# Patient Record
Sex: Female | Born: 2000 | Race: Black or African American | Hispanic: Yes | Marital: Married | Smoking: Never smoker
Health system: Southern US, Community
[De-identification: ages and names within clinical notes are randomized; demographics above are authoritative.]

## PROBLEM LIST (undated history)

## (undated) DIAGNOSIS — O139 Gestational [pregnancy-induced] hypertension without significant proteinuria, unspecified trimester: Secondary | ICD-10-CM

## (undated) DIAGNOSIS — Z789 Other specified health status: Secondary | ICD-10-CM

## (undated) DIAGNOSIS — I10 Essential (primary) hypertension: Secondary | ICD-10-CM

## (undated) HISTORY — DX: Gestational (pregnancy-induced) hypertension without significant proteinuria, unspecified trimester: O13.9

## (undated) HISTORY — PX: NO PAST SURGERIES: SHX2092

---
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
## 2018-08-21 ENCOUNTER — Emergency Department (HOSPITAL_COMMUNITY)
Admission: EM | Admit: 2018-08-21 | Discharge: 2018-08-21 | Disposition: A | Discharge status: Home / Self Care | Payer: Self-pay | Attending: Emergency Medicine | Admitting: Emergency Medicine

## 2018-08-21 ENCOUNTER — Other Ambulatory Visit: Payer: Self-pay

## 2018-08-21 ENCOUNTER — Encounter (HOSPITAL_COMMUNITY): Payer: Self-pay | Admitting: Emergency Medicine

## 2018-08-21 DIAGNOSIS — N898 Other specified noninflammatory disorders of vagina: Secondary | ICD-10-CM | POA: Insufficient documentation

## 2018-08-21 DIAGNOSIS — Z202 Contact with and (suspected) exposure to infections with a predominantly sexual mode of transmission: Secondary | ICD-10-CM | POA: Insufficient documentation

## 2018-08-21 DIAGNOSIS — N3000 Acute cystitis without hematuria: Secondary | ICD-10-CM | POA: Insufficient documentation

## 2018-08-21 DIAGNOSIS — L249 Irritant contact dermatitis, unspecified cause: Secondary | ICD-10-CM | POA: Insufficient documentation

## 2018-08-21 LAB — URINALYSIS, ROUTINE W REFLEX MICROSCOPIC
Bilirubin Urine: NEGATIVE
Glucose, UA: NEGATIVE mg/dL
Hgb urine dipstick: NEGATIVE
Ketones, ur: NEGATIVE mg/dL
Leukocytes,Ua: NEGATIVE
Nitrite: POSITIVE — AB
Protein, ur: NEGATIVE mg/dL
Specific Gravity, Urine: 1.02 (ref 1.005–1.030)
pH: 6.5 (ref 5.0–8.0)

## 2018-08-21 LAB — WET PREP, GENITAL
Clue Cells Wet Prep HPF POC: NONE SEEN
Sperm: NONE SEEN
Trich, Wet Prep: NONE SEEN
Yeast Wet Prep HPF POC: NONE SEEN

## 2018-08-21 LAB — URINALYSIS, MICROSCOPIC (REFLEX)

## 2018-08-21 LAB — I-STAT BETA HCG BLOOD, ED (MC, WL, AP ONLY): I-stat hCG, quantitative: <5 m[IU]/mL (ref <5)

## 2018-08-21 MED ORDER — CEFTRIAXONE SODIUM 250 MG IJ SOLR
250.0000 mg | Freq: Once | INTRAMUSCULAR | Status: AC
  Administered 2018-08-21: 250 mg via INTRAMUSCULAR
  Filled 2018-08-21: qty 250

## 2018-08-21 MED ORDER — CEPHALEXIN 500 MG PO CAPS: 500.0000 mg | ORAL_CAPSULE | Freq: Two times a day (BID) | ORAL | 0 refills | Status: AC

## 2018-08-21 MED ORDER — STERILE WATER FOR INJECTION IJ SOLN
INTRAMUSCULAR | Status: AC
  Administered 2018-08-21: 10 mL
  Filled 2018-08-21: qty 10

## 2018-08-21 MED ORDER — AZITHROMYCIN 250 MG PO TABS
1000.0000 mg | ORAL_TABLET | Freq: Once | ORAL | Status: AC
  Administered 2018-08-21: 1000 mg via ORAL
  Filled 2018-08-21: qty 4

## 2018-08-21 MED ORDER — AQUAPHOR EX OINT: TOPICAL_OINTMENT | CUTANEOUS | 0 refills | Status: DC | PRN

## 2018-08-21 MED ORDER — HYDROCORTISONE 1 % EX CREA: TOPICAL_CREAM | CUTANEOUS | 0 refills | Status: DC

## 2018-08-21 NOTE — ED Triage Notes (Signed)
Pt reports a rash on her back, "its getting better but I wanted to get it checked out."

## 2018-08-21 NOTE — Discharge Instructions (Signed)
STD testing was obtained in the emergency department.  You will be called if the results are positive in approximately 3 days. You will need to follow-up with health department if these are positive.  You likely contact dermatitis.  I have given you a steroid prescription.  Do not place to face or genitalia.

## 2018-08-21 NOTE — ED Provider Notes (Signed)
[facility]   CSN: [id number] Arrival date & time: 08/21/18  2114   History   Chief Complaint Chief Complaint  Patient presents with   Rash   HPI Christie Fowler is a 18 y.o. female with no significant past medical history who presents for evaluation of rash.  Patient states she has a rash to her left mid back over the last week.  Patient states rash has significantly improved however she "just wants to get it checked out."  Denies any difficulty breathing, new contacts, lotions, perfumes or insect bites.  Patient states she would also like to be tested for STDs.  She presents with significant other who like to be tested as well.  She is sexually active with female partners.  She intermittently uses protection.  States she has a white vaginal discharge however no abdominal pain.  No prior history of STDs.  Denies fever, chills, nausea, vomiting, chest pain, shortness breath, abdominal pain, pelvic pain, constipation.  Has not take anything for symptoms PTA.  Denies additional aggravating or alleviating factors. Has had some mild burning with urination.  History of fever patient and past medical records.  Interpreter is used.     HPI  History reviewed. No pertinent past medical history.  There are no active problems to display for this patient.   History reviewed. No pertinent surgical history.   OB History   No obstetric history on file.      Home Medications    Prior to Admission medications   Medication Sig Start Date End Date Taking? Authorizing Provider  cephALEXin (KEFLEX) 500 MG capsule Take 1 capsule (500 mg total) by mouth 2 (two) times daily for 5 days. 08/21/18 08/26/18  Brighten Orndoff A, PA-C  hydrocortisone cream 1 % Apply to affected area 2 times daily. Do not apply to the face 08/21/18   Tayjah Lobdell A, PA-C  mineral oil-hydrophilic petrolatum (AQUAPHOR) ointment Apply topically as needed for dry skin. 08/21/18    Ronnita Paz A, PA-C    Family History No family history on file.  Social History Social History   Tobacco Use   Smoking status: Not on file  Substance Use Topics   Alcohol use: Not on file   Drug use: Not on file     Allergies   Patient has no known allergies.   Review of Systems Review of Systems  Constitutional: Negative.   HENT: Negative.   Respiratory: Negative.   Cardiovascular: Negative.   Gastrointestinal: Negative.   Genitourinary: Positive for vaginal discharge. Negative for decreased urine volume, difficulty urinating, dysuria, flank pain, frequency, genital sores, hematuria, menstrual problem, pelvic pain, vaginal bleeding and vaginal pain.  Musculoskeletal: Negative.   Skin: Positive for rash.  All other systems reviewed and are negative.    Physical Exam Updated Vital Signs BP 113/73 (BP Location: Right Arm)    Pulse 65    Temp 97.7 F (36.5 C) (Oral)    Resp 12    SpO2 100%   Physical Exam Vitals signs and nursing note reviewed.  Constitutional:      General: She is not in acute distress.    Appearance: She is well-developed. She is not ill-appearing, toxic-appearing or diaphoretic.  HENT:     Head: Normocephalic and atraumatic.     Comments: No oral lesions or oral swelling.    Nose: Nose normal.     Mouth/Throat:     Mouth: Mucous membranes are moist.  Pharynx: Oropharynx is clear.  Eyes:     Pupils: Pupils are equal, round, and reactive to light.  Neck:     Musculoskeletal: Normal range of motion.  Cardiovascular:     Rate and Rhythm: Normal rate.     Pulses: Normal pulses.     Heart sounds: Normal heart sounds. No murmur. No friction rub. No gallop.   Pulmonary:     Effort: Pulmonary effort is normal. No respiratory distress.     Breath sounds: Normal breath sounds.  Abdominal:     General: Bowel sounds are normal. There is no distension.     Tenderness: There is no abdominal tenderness. There is no guarding or rebound.       Hernia: No hernia is present.  Genitourinary:    Comments: Normal appearing external female genitalia without rashes or lesions, normal vaginal epithelium. Normal appearing cervix with white discharge. No petechiae. Cervical os is closed. There is no bleeding noted at the os.No Odor. Bimanual: No CMT,nontender.  No palpable adnexal masses or tenderness. Uterus midline and not fixed. Rectovaginal exam was deferred.  No cystocele or rectocele noted. No pelvic lymphadenopathy noted. Wet prep was obtained.  Cultures for gonorrhea and chlamydia collected. Exam performed with chaperone in room. Musculoskeletal: Normal range of motion.  Skin:    General: Skin is warm and dry.     Comments: No bulla, vesicles, target lesions, urticaria, purpura macules.  Small, punctate areas of papules approximately 1 inch on her left mid back.  No fluctuance, induration.  No redness, swelling or warmth.  Neurological:     Mental Status: She is alert.    ED Treatments / Results  Labs (all labs ordered are listed, but only abnormal results are displayed) Labs Reviewed  WET PREP, GENITAL - Abnormal; Notable for the following components:      Result Value   WBC, Wet Prep HPF POC FEW (*)    All other components within normal limits  URINALYSIS, ROUTINE W REFLEX MICROSCOPIC - Abnormal; Notable for the following components:   APPearance CLOUDY (*)    Nitrite POSITIVE (*)    All other components within normal limits  URINALYSIS, MICROSCOPIC (REFLEX) - Abnormal; Notable for the following components:   Bacteria, UA MANY (*)    All other components within normal limits  RPR  HIV ANTIBODY (ROUTINE TESTING W REFLEX)  I-STAT BETA HCG BLOOD, ED ([facility], [facility], [facility] ONLY)  GC/CHLAMYDIA PROBE AMP ([place]) NOT AT [facility]    EKG None  Radiology No results found.  Procedures Procedures (including critical care time)  Medications Ordered in ED Medications  cefTRIAXone (ROCEPHIN) injection 250 mg (250 mg Intramuscular  Given 08/21/18 2349)  azithromycin (ZITHROMAX) tablet 1,000 mg (1,000 mg Oral Given 08/21/18 2348)  sterile water (preservative free) injection (10 mLs  Given 08/21/18 2349)   Initial Impression / Assessment and Plan / ED Course  I have reviewed the triage vital signs and the nursing notes.  Pertinent labs & imaging results that were available during my care of the patient were reviewed by me and considered in my medical decision making (see chart for details).  18 year old female who appears otherwise well presents for evaluation of 2 separate complaints. Patient with rash over the last week which has majority to resolved.  No new contacts, lotions or perfumes.  No oral lesions or lacerations.  Small, punctate papules to left mid back.  Consistent with contact dermatitis. Patient denies any difficulty breathing or swallowing.  Pt has a  patent airway without stridor and is handling secretions without difficulty; no angioedema. No blisters, no pustules, no warmth, no draining sinus tracts, no superficial abscesses, no bullous impetigo, no vesicles, no desquamation, no target lesions with dusky purpura or a central bulla. Not tender to touch. No concern for superimposed infection. No concern for SJS, TEN, TSS, tick borne illness, syphilis or other life-threatening condition.   Patient also with concerns about STDs.  She has a white vaginal discharge.  Abdomen soft, nontender without rebound or guarding.  She has no adnexal tenderness or cervical motion tenderness.  Low suspicion for PID, torsion or TOA.  Has had some mild burning with urination. Pt understands that they have GC/Chlamydia cultures pending and that they will need to inform all sexual partners if results return positive. Pt has been treated prophylactically with azithromycin and Rocephin due to pts history, pelvic exam, and wet prep with increased WBCs. Patient to be discharged with instructions to follow up with OBGYN/PCP. Discussed importance  of using protection when sexually active.  Urinalysis positive for infection.  DC home with antibiotics.  No evidence of pyelonephritis. Tolerating PO intake without difficulty.  The patient has been appropriately medically screened and/or stabilized in the ED. I have low suspicion for any other emergent medical condition which would require further screening, evaluation or treatment in the ED or require inpatient management. Patient is hemodynamically stable and in no acute distress.  Patient able to ambulate in department prior to ED.  Evaluation does not show acute pathology that would require ongoing or additional emergent interventions while in the emergency department or further inpatient treatment.  I have discussed the diagnosis with the patient and answered all questions.  Patient has no further complaints prior to discharge.  Patient is comfortable with plan discussed in room and is stable for discharge at this time.  I have discussed strict return precautions for returning to the emergency department.  Patient was encouraged to follow-up with PCP/specialist refer to at discharge.     Final Clinical Impressions(s) / ED Diagnoses   Final diagnoses:  Irritant contact dermatitis, unspecified trigger  Possible exposure to STD  Acute cystitis without hematuria    ED Discharge Orders         Ordered    mineral oil-hydrophilic petrolatum (AQUAPHOR) ointment  As needed     08/21/18 2318    hydrocortisone cream 1 %     08/21/18 2318    cephALEXin (KEFLEX) 500 MG capsule  2 times daily     08/21/18 2354           Haldon Carley A, PA-C 08/21/18 2359    Azalia Bilisampos, Kevin, MD 08/22/18 0015

## 2018-08-22 LAB — HIV ANTIBODY (ROUTINE TESTING W REFLEX): HIV Screen 4th Generation wRfx: NONREACTIVE

## 2018-08-22 LAB — RPR: RPR Ser Ql: NONREACTIVE

## 2018-08-26 LAB — GC/CHLAMYDIA PROBE AMP (~~LOC~~) NOT AT ARMC
Chlamydia: NEGATIVE
Neisseria Gonorrhea: NEGATIVE

## 2018-10-24 DIAGNOSIS — Z5321 Procedure and treatment not carried out due to patient leaving prior to being seen by health care provider: Secondary | ICD-10-CM | POA: Insufficient documentation

## 2018-10-25 ENCOUNTER — Other Ambulatory Visit: Payer: Self-pay

## 2018-10-25 ENCOUNTER — Encounter (HOSPITAL_COMMUNITY): Payer: Self-pay | Admitting: Emergency Medicine

## 2018-10-25 ENCOUNTER — Emergency Department (HOSPITAL_COMMUNITY)
Admission: EM | Admit: 2018-10-25 | Discharge: 2018-10-25 | Discharge status: Against Medical Advice | Payer: Self-pay | Attending: Emergency Medicine | Admitting: Emergency Medicine

## 2018-10-25 NOTE — ED Triage Notes (Signed)
Pt c/o pain in left upper back tooth x's 2 days.

## 2018-11-18 ENCOUNTER — Encounter (HOSPITAL_COMMUNITY): Payer: Self-pay

## 2018-11-18 ENCOUNTER — Other Ambulatory Visit: Payer: Self-pay

## 2018-11-18 DIAGNOSIS — R1110 Vomiting, unspecified: Secondary | ICD-10-CM | POA: Insufficient documentation

## 2018-11-18 DIAGNOSIS — M549 Dorsalgia, unspecified: Secondary | ICD-10-CM | POA: Insufficient documentation

## 2018-11-18 DIAGNOSIS — Z5321 Procedure and treatment not carried out due to patient leaving prior to being seen by health care provider: Secondary | ICD-10-CM | POA: Insufficient documentation

## 2018-11-18 DIAGNOSIS — R509 Fever, unspecified: Secondary | ICD-10-CM | POA: Insufficient documentation

## 2018-11-18 LAB — BASIC METABOLIC PANEL
Anion gap: 10 (ref 5–15)
BUN: 11 mg/dL (ref 6–20)
CO2: 23 mmol/L (ref 22–32)
Calcium: 9.9 mg/dL (ref 8.9–10.3)
Chloride: 105 mmol/L (ref 98–111)
Creatinine, Ser: 0.7 mg/dL (ref 0.44–1.00)
GFR calc Af Amer: >60 mL/min (ref >60)
GFR calc non Af Amer: >60 mL/min (ref >60)
Glucose, Bld: 125 mg/dL — ABNORMAL HIGH (ref 70–99)
Potassium: 3.7 mmol/L (ref 3.5–5.1)
Sodium: 138 mmol/L (ref 135–145)

## 2018-11-18 LAB — CBC
HCT: 37.3 % (ref 36.0–46.0)
Hemoglobin: 12.1 g/dL (ref 12.0–15.0)
MCH: 30 pg (ref 26.0–34.0)
MCHC: 32.4 g/dL (ref 30.0–36.0)
MCV: 92.3 fL (ref 80.0–100.0)
Platelets: 129 10*3/uL — ABNORMAL LOW (ref 150–400)
RBC: 4.04 MIL/uL (ref 3.87–5.11)
RDW: 13.4 % (ref 11.5–15.5)
WBC: 8.4 10*3/uL (ref 4.0–10.5)
nRBC: 0 % (ref 0.0–0.2)

## 2018-11-18 LAB — URINALYSIS, ROUTINE W REFLEX MICROSCOPIC
Bilirubin Urine: NEGATIVE
Glucose, UA: NEGATIVE mg/dL
Hgb urine dipstick: NEGATIVE
Ketones, ur: 80 mg/dL — AB
Leukocytes,Ua: NEGATIVE
Nitrite: POSITIVE — AB
Protein, ur: 30 mg/dL — AB
Specific Gravity, Urine: 1.021 (ref 1.005–1.030)
pH: 6 (ref 5.0–8.0)

## 2018-11-18 LAB — PREGNANCY, URINE: Preg Test, Ur: NEGATIVE

## 2018-11-18 NOTE — ED Notes (Signed)
Called pt x2 for lab draw No response

## 2018-11-18 NOTE — ED Notes (Signed)
Pt states she has had nausea and vomiting with the abdominal pain. Pt states last time she vomited was about @ 8pm

## 2018-11-18 NOTE — ED Triage Notes (Signed)
Pt complains of vomiting a dn a fever since last night and lower back pain, she states that the last time she had a kidney infection

## 2018-11-19 ENCOUNTER — Emergency Department (HOSPITAL_COMMUNITY)
Admission: EM | Admit: 2018-11-19 | Discharge: 2018-11-19 | Disposition: A | Discharge status: Home / Self Care | Payer: Self-pay | Attending: Emergency Medicine | Admitting: Emergency Medicine

## 2018-11-21 LAB — URINE CULTURE: Culture: >=100000 — AB

## 2018-11-22 ENCOUNTER — Telehealth: Payer: Self-pay | Admitting: Emergency Medicine

## 2018-11-22 NOTE — Telephone Encounter (Signed)
Post ED Visit - Positive Culture Follow-up: Unsuccessful Patient Follow-up  Culture assessed and recommendations reviewed by:  []  Elenor Quinones, Pharm.D. []  Heide Guile, Pharm.D., BCPS AQ-ID []  Parks Neptune, Pharm.D., BCPS []  Alycia Rossetti, Pharm.D., BCPS []  [place], Pharm.D., BCPS, AAHIVP []  Legrand Como, Pharm.D., BCPS, AAHIVP [x]  Reuel Boom, PharmD []  Vincenza Hews, PharmD, BCPS  Positive urine culture  [x]  Patient left being seen or without antimicrobial prescription and treatment is now indicated []  Organism is resistant to prescribed ED discharge antimicrobial []  Patient with positive blood cultures   Unable to contact patient at phone number on file, letter will be sent to address on file  Christie Fowler 11/22/2018, 4:00 PM

## 2019-07-10 DIAGNOSIS — M25571 Pain in right ankle and joints of right foot: Secondary | ICD-10-CM | POA: Diagnosis not present

## 2019-07-10 DIAGNOSIS — R1030 Lower abdominal pain, unspecified: Secondary | ICD-10-CM | POA: Diagnosis not present

## 2019-07-10 DIAGNOSIS — S299XXA Unspecified injury of thorax, initial encounter: Secondary | ICD-10-CM | POA: Diagnosis not present

## 2019-07-10 DIAGNOSIS — S99912A Unspecified injury of left ankle, initial encounter: Secondary | ICD-10-CM | POA: Diagnosis not present

## 2019-07-10 DIAGNOSIS — S99922A Unspecified injury of left foot, initial encounter: Secondary | ICD-10-CM | POA: Diagnosis not present

## 2019-07-10 DIAGNOSIS — S8992XA Unspecified injury of left lower leg, initial encounter: Secondary | ICD-10-CM | POA: Diagnosis not present

## 2019-07-10 DIAGNOSIS — S99921A Unspecified injury of right foot, initial encounter: Secondary | ICD-10-CM | POA: Diagnosis not present

## 2019-07-10 DIAGNOSIS — S199XXA Unspecified injury of neck, initial encounter: Secondary | ICD-10-CM | POA: Diagnosis not present

## 2019-07-10 DIAGNOSIS — M25572 Pain in left ankle and joints of left foot: Secondary | ICD-10-CM | POA: Diagnosis not present

## 2019-07-10 DIAGNOSIS — S99911A Unspecified injury of right ankle, initial encounter: Secondary | ICD-10-CM | POA: Diagnosis not present

## 2019-07-10 DIAGNOSIS — S8991XA Unspecified injury of right lower leg, initial encounter: Secondary | ICD-10-CM | POA: Diagnosis not present

## 2019-07-10 DIAGNOSIS — S4992XA Unspecified injury of left shoulder and upper arm, initial encounter: Secondary | ICD-10-CM | POA: Diagnosis not present

## 2019-07-10 DIAGNOSIS — M25519 Pain in unspecified shoulder: Secondary | ICD-10-CM | POA: Diagnosis not present

## 2019-07-10 DIAGNOSIS — S4991XA Unspecified injury of right shoulder and upper arm, initial encounter: Secondary | ICD-10-CM | POA: Diagnosis not present

## 2019-07-13 ENCOUNTER — Encounter: Payer: Self-pay | Admitting: Obstetrics

## 2019-07-14 ENCOUNTER — Ambulatory Visit: Payer: Self-pay

## 2019-07-21 ENCOUNTER — Other Ambulatory Visit: Payer: Self-pay

## 2019-07-21 ENCOUNTER — Encounter: Payer: Self-pay | Admitting: Obstetrics

## 2019-09-06 DIAGNOSIS — Z419 Encounter for procedure for purposes other than remedying health state, unspecified: Secondary | ICD-10-CM | POA: Diagnosis not present

## 2019-10-02 DIAGNOSIS — O0932 Supervision of pregnancy with insufficient antenatal care, second trimester: Secondary | ICD-10-CM | POA: Diagnosis not present

## 2019-10-02 DIAGNOSIS — Z3A19 19 weeks gestation of pregnancy: Secondary | ICD-10-CM | POA: Diagnosis not present

## 2019-10-02 DIAGNOSIS — Z363 Encounter for antenatal screening for malformations: Secondary | ICD-10-CM | POA: Diagnosis not present

## 2019-10-02 DIAGNOSIS — K219 Gastro-esophageal reflux disease without esophagitis: Secondary | ICD-10-CM | POA: Diagnosis not present

## 2019-10-07 DIAGNOSIS — Z419 Encounter for procedure for purposes other than remedying health state, unspecified: Secondary | ICD-10-CM | POA: Diagnosis not present

## 2019-10-20 ENCOUNTER — Encounter (HOSPITAL_COMMUNITY): Payer: Self-pay | Admitting: Obstetrics and Gynecology

## 2019-10-20 ENCOUNTER — Other Ambulatory Visit: Payer: Self-pay

## 2019-10-20 ENCOUNTER — Inpatient Hospital Stay (HOSPITAL_COMMUNITY)
Admission: AD | Admit: 2019-10-20 | Discharge: 2019-10-20 | Disposition: A | Discharge status: Home / Self Care | Payer: Medicaid Other | Attending: Obstetrics and Gynecology | Admitting: Obstetrics and Gynecology

## 2019-10-20 DIAGNOSIS — O99352 Diseases of the nervous system complicating pregnancy, second trimester: Secondary | ICD-10-CM | POA: Insufficient documentation

## 2019-10-20 DIAGNOSIS — Z3A22 22 weeks gestation of pregnancy: Secondary | ICD-10-CM | POA: Diagnosis not present

## 2019-10-20 DIAGNOSIS — M549 Dorsalgia, unspecified: Secondary | ICD-10-CM | POA: Diagnosis not present

## 2019-10-20 DIAGNOSIS — O212 Late vomiting of pregnancy: Secondary | ICD-10-CM | POA: Insufficient documentation

## 2019-10-20 DIAGNOSIS — Z743 Need for continuous supervision: Secondary | ICD-10-CM | POA: Diagnosis not present

## 2019-10-20 DIAGNOSIS — O26892 Other specified pregnancy related conditions, second trimester: Secondary | ICD-10-CM | POA: Insufficient documentation

## 2019-10-20 DIAGNOSIS — R112 Nausea with vomiting, unspecified: Secondary | ICD-10-CM

## 2019-10-20 DIAGNOSIS — O99891 Other specified diseases and conditions complicating pregnancy: Secondary | ICD-10-CM | POA: Diagnosis not present

## 2019-10-20 DIAGNOSIS — R519 Headache, unspecified: Secondary | ICD-10-CM | POA: Insufficient documentation

## 2019-10-20 DIAGNOSIS — M791 Myalgia, unspecified site: Secondary | ICD-10-CM

## 2019-10-20 DIAGNOSIS — R1084 Generalized abdominal pain: Secondary | ICD-10-CM | POA: Diagnosis not present

## 2019-10-20 DIAGNOSIS — Z3492 Encounter for supervision of normal pregnancy, unspecified, second trimester: Secondary | ICD-10-CM

## 2019-10-20 LAB — URINALYSIS, ROUTINE W REFLEX MICROSCOPIC
Bilirubin Urine: NEGATIVE
Glucose, UA: NEGATIVE mg/dL
Hgb urine dipstick: NEGATIVE
Ketones, ur: NEGATIVE mg/dL
Leukocytes,Ua: NEGATIVE
Nitrite: NEGATIVE
Protein, ur: 30 mg/dL — AB
Specific Gravity, Urine: 1.018 (ref 1.005–1.030)
pH: 8 (ref 5.0–8.0)

## 2019-10-20 LAB — COMPREHENSIVE METABOLIC PANEL
ALT: 14 U/L (ref 0–44)
AST: 21 U/L (ref 15–41)
Albumin: 3.7 g/dL (ref 3.5–5.0)
Alkaline Phosphatase: 80 U/L (ref 38–126)
Anion gap: 9 (ref 5–15)
BUN: 6 mg/dL (ref 6–20)
CO2: 23 mmol/L (ref 22–32)
Calcium: 9.1 mg/dL (ref 8.9–10.3)
Chloride: 102 mmol/L (ref 98–111)
Creatinine, Ser: 0.59 mg/dL (ref 0.44–1.00)
GFR calc Af Amer: >60 mL/min (ref >60)
GFR calc non Af Amer: >60 mL/min (ref >60)
Glucose, Bld: 74 mg/dL (ref 70–99)
Potassium: 3.9 mmol/L (ref 3.5–5.1)
Sodium: 134 mmol/L — ABNORMAL LOW (ref 135–145)
Total Bilirubin: 0.9 mg/dL (ref 0.3–1.2)
Total Protein: 6.9 g/dL (ref 6.5–8.1)

## 2019-10-20 LAB — RAPID URINE DRUG SCREEN, HOSP PERFORMED
Amphetamines: NOT DETECTED
Barbiturates: NOT DETECTED
Benzodiazepines: NOT DETECTED
Cocaine: NOT DETECTED
Opiates: NOT DETECTED
Tetrahydrocannabinol: NOT DETECTED

## 2019-10-20 LAB — CBC
HCT: 34.1 % — ABNORMAL LOW (ref 36.0–46.0)
Hemoglobin: 11.3 g/dL — ABNORMAL LOW (ref 12.0–15.0)
MCH: 30.9 pg (ref 26.0–34.0)
MCHC: 33.1 g/dL (ref 30.0–36.0)
MCV: 93.2 fL (ref 80.0–100.0)
Platelets: 121 10*3/uL — ABNORMAL LOW (ref 150–400)
RBC: 3.66 MIL/uL — ABNORMAL LOW (ref 3.87–5.11)
RDW: 13.2 % (ref 11.5–15.5)
WBC: 6.8 10*3/uL (ref 4.0–10.5)
nRBC: 0 % (ref 0.0–0.2)

## 2019-10-20 MED ORDER — LACTATED RINGERS IV BOLUS
1000.0000 mL | Freq: Once | INTRAVENOUS | Status: AC
  Administered 2019-10-20: 1000 mL via INTRAVENOUS

## 2019-10-20 MED ORDER — DEXAMETHASONE SODIUM PHOSPHATE 10 MG/ML IJ SOLN
10.0000 mg | Freq: Once | INTRAMUSCULAR | Status: AC
  Administered 2019-10-20: 10 mg via INTRAVENOUS
  Filled 2019-10-20: qty 1

## 2019-10-20 MED ORDER — DIPHENHYDRAMINE HCL 50 MG/ML IJ SOLN
25.0000 mg | Freq: Once | INTRAMUSCULAR | Status: AC
  Administered 2019-10-20: 25 mg via INTRAVENOUS
  Filled 2019-10-20: qty 1

## 2019-10-20 MED ORDER — ONDANSETRON 4 MG PO TBDP
4.0000 mg | ORAL_TABLET | Freq: Once | ORAL | Status: AC
  Administered 2019-10-20: 4 mg via ORAL
  Filled 2019-10-20: qty 1

## 2019-10-20 MED ORDER — METOCLOPRAMIDE HCL 5 MG/ML IJ SOLN
10.0000 mg | Freq: Once | INTRAMUSCULAR | Status: AC
  Administered 2019-10-20: 10 mg via INTRAVENOUS
  Filled 2019-10-20: qty 2

## 2019-10-20 NOTE — MAU Note (Addendum)
Christie Fowler is a 19 y.o. at [redacted]w[redacted]d here in MAU reporting: pt arrived via EMS stating that last night she started feeling sick. States symptoms got worse after she has sex last night. States she has been having nausea and vomiting. Emesis x 2 since yesterday.   Onset of complaint: yesterday  Pain score: headache 6/10, back pain 9/10  Vitals:   10/20/19 1510  BP: (!) 110/58  Pulse: 90  Resp: 16  Temp: 98.6 F (37 C)  SpO2: 100%     FHT: 159  Lab orders placed from triage: UA

## 2019-10-20 NOTE — Discharge Instructions (Signed)
Second Trimester of Pregnancy  The second trimester is from week 14 through week 27 (month 4 through 6). This is often the time in pregnancy that you feel your best. Often times, morning sickness has lessened or quit. You may have more energy, and you may get hungry more often. Your unborn baby is growing rapidly. At the end of the sixth month, he or she is about 9 inches long and weighs about 1 pounds. You will likely feel the baby move between 18 and 20 weeks of pregnancy. Follow these instructions at home: Medicines  Take over-the-counter and prescription medicines only as told by your doctor. Some medicines are safe and some medicines are not safe during pregnancy.  Take a prenatal vitamin that contains at least 600 micrograms (mcg) of folic acid.  If you have trouble pooping (constipation), take medicine that will make your stool soft (stool softener) if your doctor approves. Eating and drinking   Eat regular, healthy meals.  Avoid raw meat and uncooked cheese.  If you get low calcium from the food you eat, talk to your doctor about taking a daily calcium supplement.  Avoid foods that are high in fat and sugars, such as fried and sweet foods.  If you feel sick to your stomach (nauseous) or throw up (vomit): ? Eat 4 or 5 small meals a day instead of 3 large meals. ? Try eating a few soda crackers. ? Drink liquids between meals instead of during meals.  To prevent constipation: ? Eat foods that are high in fiber, like fresh fruits and vegetables, whole grains, and beans. ? Drink enough fluids to keep your pee (urine) clear or pale yellow. Activity  Exercise only as told by your doctor. Stop exercising if you start to have cramps.  Do not exercise if it is too hot, too humid, or if you are in a place of great height (high altitude).  Avoid heavy lifting.  Wear low-heeled shoes. Sit and stand up straight.  You can continue to have sex unless your doctor tells you not  to. Relieving pain and discomfort  Wear a good support bra if your breasts are tender.  Take warm water baths (sitz baths) to soothe pain or discomfort caused by hemorrhoids. Use hemorrhoid cream if your doctor approves.  Rest with your legs raised if you have leg cramps or low back pain.  If you develop puffy, bulging veins (varicose veins) in your legs: ? Wear support hose or compression stockings as told by your doctor. ? Raise (elevate) your feet for 15 minutes, 3-4 times a day. ? Limit salt in your food. Prenatal care  Write down your questions. Take them to your prenatal visits.  Keep all your prenatal visits as told by your doctor. This is important. Safety  Wear your seat belt when driving.  Make a list of emergency phone numbers, including numbers for family, friends, the hospital, and police and fire departments. General instructions  Ask your doctor about the right foods to eat or for help finding a counselor, if you need these services.  Ask your doctor about local prenatal classes. Begin classes before month 6 of your pregnancy.  Do not use hot tubs, steam rooms, or saunas.  Do not douche or use tampons or scented sanitary pads.  Do not cross your legs for long periods of time.  Visit your dentist if you have not done so. Use a soft toothbrush to brush your teeth. Floss gently.  Avoid all smoking, herbs,   and alcohol. Avoid drugs that are not approved by your doctor.  Do not use any products that contain nicotine or tobacco, such as cigarettes and e-cigarettes. If you need help quitting, ask your doctor.  Avoid cat litter boxes and soil used by cats. These carry germs that can cause birth defects in the baby and can cause a loss of your baby (miscarriage) or stillbirth. Contact a doctor if:  You have mild cramps or pressure in your lower belly.  You have pain when you pee (urinate).  You have bad smelling fluid coming from your vagina.  You continue to  feel sick to your stomach (nauseous), throw up (vomit), or have watery poop (diarrhea).  You have a nagging pain in your belly area.  You feel dizzy. Get help right away if:  You have a fever.  You are leaking fluid from your vagina.  You have spotting or bleeding from your vagina.  You have severe belly cramping or pain.  You lose or gain weight rapidly.  You have trouble catching your breath and have chest pain.  You notice sudden or extreme puffiness (swelling) of your face, hands, ankles, feet, or legs.  You have not felt the baby move in over an hour.  You have severe headaches that do not go away when you take medicine.  You have trouble seeing. Summary  The second trimester is from week 14 through week 27 (months 4 through 6). This is often the time in pregnancy that you feel your best.  To take care of yourself and your unborn baby, you will need to eat healthy meals, take medicines only if your doctor tells you to do so, and do activities that are safe for you and your baby.  Call your doctor if you get sick or if you notice anything unusual about your pregnancy. Also, call your doctor if you need help with the right food to eat, or if you want to know what activities are safe for you. This information is not intended to replace advice given to you by your health care provider. Make sure you discuss any questions you have with your health care provider. Document Revised: 05/16/2018 Document Reviewed: 02/28/2016 Elsevier Patient Education  2020 [employer]  Safe Medications in Pregnancy   Acne: Benzoyl Peroxide Salicylic Acid  Backache/Headache: Tylenol: 2 regular strength every 4 hours OR              2 Extra strength every 6 hours  Colds/Coughs/Allergies: Benadryl (alcohol free) 25 mg every 6 hours as needed Breath right strips Claritin Cepacol throat lozenges Chloraseptic throat spray Cold-Eeze- up to three times per day Cough drops, alcohol  free Flonase (by prescription only) Guaifenesin Mucinex Robitussin DM (plain only, alcohol free) Saline nasal spray/drops Sudafed (pseudoephedrine) & Actifed ** use only after [redacted] weeks gestation and if you do not have high blood pressure Tylenol Vicks Vaporub Zinc lozenges Zyrtec   Constipation: Colace Ducolax suppositories Fleet enema Glycerin suppositories Metamucil Milk of magnesia Miralax Senokot Smooth move tea  Diarrhea: Kaopectate Imodium A-D  *NO pepto Bismol  Hemorrhoids: Anusol Anusol HC Preparation H Tucks  Indigestion: Tums Maalox Mylanta Zantac  Pepcid  Insomnia: Benadryl (alcohol free) 25mg every 6 hours as needed Tylenol PM Unisom, no Gelcaps  Leg Cramps: Tums MagGel  Nausea/Vomiting:  Bonine Dramamine Emetrol Ginger extract Sea bands Meclizine  Nausea medication to take during pregnancy:  Unisom (doxylamine succinate 25 mg tablets) Take one tablet daily at bedtime. If symptoms are   not adequately controlled, the dose can be increased to a maximum recommended dose of two tablets daily (1/2 tablet in the morning, 1/2 tablet mid-afternoon and one at bedtime). Vitamin B6 100mg tablets. Take one tablet twice a day (up to 200 mg per day).  Skin Rashes: Aveeno products Benadryl cream or 25mg every 6 hours as needed Calamine Lotion 1% cortisone cream  Yeast infection: Gyne-lotrimin 7 Monistat 7   **If taking multiple medications, please check labels to avoid duplicating the same active ingredients **take medication as directed on the label ** Do not exceed 4000 mg of tylenol in 24 hours **Do not take medications that contain aspirin or ibuprofen     

## 2019-10-20 NOTE — MAU Provider Note (Signed)
History     CSN: [id number]  Arrival date and time: 10/20/19 1448   None     Chief Complaint  Patient presents with  . Back Pain  . Headache  . Nausea  . Emesis   HPI Christie Fowler is a 19 y.o. G1P0 at [redacted]w[redacted]d who presents to [facility]U from Urgent Care via EMS for evaluation of multiple complaints, all new onset last night after intercourse.  Nausea and Vomiting Patient endorses intermittent episodes of vomiting, most recently in the lobby of Urgent Care. She denies prolonged, recurrent vomiting.  Headache This is a new problem, onset today. Patient denies history of headaches. She rates her bilateral, anterior pain as 9/10. Her pain does not radiate.  Muscle aches Patient states her pelvis and leg muscles ache and her pain radiates down her left thigh.  "Feeling Weak" Patient states she felt as if she wasn't strong enough to support herself after episodes of vomiting earlier today.  Patient has not taken medication or tried other treatments for these complaints. She states Urgent Care performed a rapid COVID test that was negative.  Patient receives prenatal care with [facility].  OB History    Gravida  1   Para      Term      Preterm      AB      Living        SAB      TAB      Ectopic      Multiple      Live Births              History reviewed. No pertinent past medical history.  History reviewed. No pertinent surgical history.  Family History  Problem Relation Age of Onset  . Hypertension Father   . Diabetes Paternal Grandmother     Social History   Tobacco Use  . Smoking status: Never Smoker  . Smokeless tobacco: Never Used  Substance Use Topics  . Alcohol use: Never  . Drug use: Never    Allergies: No Known Allergies  Medications Prior to Admission  Medication Sig Dispense Refill Last Dose  . hydrocortisone cream 1 % Apply to affected area 2 times daily. Do not apply to the face 15 g 0   . mineral oil-hydrophilic petrolatum  (AQUAPHOR) ointment Apply topically as needed for dry skin. 420 g 0     Review of Systems  Gastrointestinal: Positive for nausea and vomiting. Negative for abdominal pain.  Genitourinary: Negative for vaginal discharge.  Musculoskeletal: Positive for back pain and myalgias.  Neurological: Positive for headaches.  All other systems reviewed and are negative.  Physical Exam   Blood pressure (!) 110/58, pulse 90, temperature 98.6 F (37 C), temperature source Oral, resp. rate 16, last menstrual period 11/11/2018, SpO2 100 %.  Physical Exam Vitals and nursing note reviewed.  Constitutional:      Appearance: She is well-developed. She is ill-appearing.  Cardiovascular:     Rate and Rhythm: Normal rate.     Heart sounds: Normal heart sounds.  Pulmonary:     Effort: Pulmonary effort is normal.     Breath sounds: Normal breath sounds.  Abdominal:     General: Bowel sounds are normal.     Palpations: Abdomen is soft.  Skin:    General: Skin is warm and dry.     Capillary Refill: Capillary refill takes less than 2 seconds.  Neurological:     Mental Status: She is alert.  Psychiatric:  Mood and Affect: Mood normal.        Behavior: Behavior normal.     MAU Course  Procedures  Patient Vitals for the past 24 hrs:  BP Temp Temp src Pulse Resp SpO2  10/20/19 1719 (!) 97/55 -- -- 90 -- --  10/20/19 1510 (!) 110/58 98.6 F (37 C) Oral 90 16 100 %   Results for orders placed or performed during the hospital encounter of 10/20/19 (from the past 24 hour(s))  CBC     Status: Abnormal   Collection Time: 10/20/19  3:12 PM  Result Value Ref Range   WBC 6.8 4.0 - 10.5 K/uL   RBC 3.66 (L) 3.87 - 5.11 MIL/uL   Hemoglobin 11.3 (L) 12.0 - 15.0 g/dL   HCT 93.2 (L) 36 - 46 %   MCV 93.2 80.0 - 100.0 fL   MCH 30.9 26.0 - 34.0 pg   MCHC 33.1 30.0 - 36.0 g/dL   RDW 67.1 24.5 - 80.9 %   Platelets 121 (L) 150 - 400 K/uL   nRBC 0.0 0.0 - 0.2 %  Comprehensive metabolic panel     Status:  Abnormal   Collection Time: 10/20/19  3:12 PM  Result Value Ref Range   Sodium 134 (L) 135 - 145 mmol/L   Potassium 3.9 3.5 - 5.1 mmol/L   Chloride 102 98 - 111 mmol/L   CO2 23 22 - 32 mmol/L   Glucose, Bld 74 70 - 99 mg/dL   BUN 6 6 - 20 mg/dL   Creatinine, Ser 9.83 0.44 - 1.00 mg/dL   Calcium 9.1 8.9 - 38.2 mg/dL   Total Protein 6.9 6.5 - 8.1 g/dL   Albumin 3.7 3.5 - 5.0 g/dL   AST 21 15 - 41 U/L   ALT 14 0 - 44 U/L   Alkaline Phosphatase 80 38 - 126 U/L   Total Bilirubin 0.9 0.3 - 1.2 mg/dL   GFR calc non Af Amer >60 >60 mL/min   GFR calc Af Amer >60 >60 mL/min   Anion gap 9 5 - 15  Urinalysis, Routine w reflex microscopic Urine, Clean Catch     Status: Abnormal   Collection Time: 10/20/19  3:13 PM  Result Value Ref Range   Color, Urine YELLOW YELLOW   APPearance CLOUDY (A) CLEAR   Specific Gravity, Urine 1.018 1.005 - 1.030   pH 8.0 5.0 - 8.0   Glucose, UA NEGATIVE NEGATIVE mg/dL   Hgb urine dipstick NEGATIVE NEGATIVE   Bilirubin Urine NEGATIVE NEGATIVE   Ketones, ur NEGATIVE NEGATIVE mg/dL   Protein, ur 30 (A) NEGATIVE mg/dL   Nitrite NEGATIVE NEGATIVE   Leukocytes,Ua NEGATIVE NEGATIVE   RBC / HPF 0-5 0 - 5 RBC/hpf   WBC, UA 0-5 0 - 5 WBC/hpf   Bacteria, UA RARE (A) NONE SEEN   Squamous Epithelial / LPF 11-20 0 - 5   Mucus PRESENT    Meds ordered this encounter  Medications  . ondansetron (ZOFRAN-ODT) disintegrating tablet 4 mg  . metoCLOPramide (REGLAN) injection 10 mg  . diphenhydrAMINE (BENADRYL) injection 25 mg  . dexamethasone (DECADRON) injection 10 mg  . lactated ringers bolus 1,000 mL   Assessment and Plan  --19 y.o. G1P0 at [redacted]w[redacted]d  --FHT 159 by Doppler --VSS --COVID negative at Urgent Care earlier today --Possible exposure to viral illness --Headache resolved with IV HA Cocktail --Given list of OTC medication for supportive tx --Discharge home in stable condition  Calvert Cantor, CNM 10/20/2019, 6:37 PM

## 2019-10-27 DIAGNOSIS — Z3A23 23 weeks gestation of pregnancy: Secondary | ICD-10-CM | POA: Diagnosis not present

## 2019-10-27 DIAGNOSIS — Z3402 Encounter for supervision of normal first pregnancy, second trimester: Secondary | ICD-10-CM | POA: Diagnosis not present

## 2019-10-27 DIAGNOSIS — Z363 Encounter for antenatal screening for malformations: Secondary | ICD-10-CM | POA: Diagnosis not present

## 2019-11-03 ENCOUNTER — Ambulatory Visit: Payer: Medicaid Other | Attending: Obstetrics and Gynecology

## 2019-11-06 DIAGNOSIS — Z419 Encounter for procedure for purposes other than remedying health state, unspecified: Secondary | ICD-10-CM | POA: Diagnosis not present

## 2019-11-30 DIAGNOSIS — Z369 Encounter for antenatal screening, unspecified: Secondary | ICD-10-CM | POA: Diagnosis not present

## 2019-11-30 DIAGNOSIS — Z23 Encounter for immunization: Secondary | ICD-10-CM | POA: Diagnosis not present

## 2019-11-30 DIAGNOSIS — Z3A28 28 weeks gestation of pregnancy: Secondary | ICD-10-CM | POA: Diagnosis not present

## 2019-11-30 DIAGNOSIS — Z3689 Encounter for other specified antenatal screening: Secondary | ICD-10-CM | POA: Diagnosis not present

## 2019-11-30 DIAGNOSIS — O0933 Supervision of pregnancy with insufficient antenatal care, third trimester: Secondary | ICD-10-CM | POA: Diagnosis not present

## 2019-12-02 ENCOUNTER — Encounter (HOSPITAL_COMMUNITY): Payer: Self-pay | Admitting: Obstetrics and Gynecology

## 2019-12-02 ENCOUNTER — Inpatient Hospital Stay (HOSPITAL_COMMUNITY): Payer: Medicaid Other

## 2019-12-02 ENCOUNTER — Other Ambulatory Visit: Payer: Self-pay

## 2019-12-02 ENCOUNTER — Inpatient Hospital Stay (HOSPITAL_COMMUNITY)
Admission: AD | Admit: 2019-12-02 | Discharge: 2019-12-02 | Disposition: A | Discharge status: Home / Self Care | Payer: Medicaid Other | Attending: Obstetrics and Gynecology | Admitting: Obstetrics and Gynecology

## 2019-12-02 DIAGNOSIS — Z3A28 28 weeks gestation of pregnancy: Secondary | ICD-10-CM | POA: Insufficient documentation

## 2019-12-02 DIAGNOSIS — R319 Hematuria, unspecified: Secondary | ICD-10-CM | POA: Diagnosis not present

## 2019-12-02 DIAGNOSIS — Z3689 Encounter for other specified antenatal screening: Secondary | ICD-10-CM

## 2019-12-02 DIAGNOSIS — Z87440 Personal history of urinary (tract) infections: Secondary | ICD-10-CM | POA: Diagnosis not present

## 2019-12-02 DIAGNOSIS — R109 Unspecified abdominal pain: Secondary | ICD-10-CM | POA: Insufficient documentation

## 2019-12-02 DIAGNOSIS — O26893 Other specified pregnancy related conditions, third trimester: Secondary | ICD-10-CM | POA: Insufficient documentation

## 2019-12-02 DIAGNOSIS — O99891 Other specified diseases and conditions complicating pregnancy: Secondary | ICD-10-CM | POA: Diagnosis not present

## 2019-12-02 DIAGNOSIS — M549 Dorsalgia, unspecified: Secondary | ICD-10-CM | POA: Diagnosis present

## 2019-12-02 LAB — URINALYSIS, ROUTINE W REFLEX MICROSCOPIC
Bilirubin Urine: NEGATIVE
Glucose, UA: NEGATIVE mg/dL
Ketones, ur: NEGATIVE mg/dL
Nitrite: NEGATIVE
Protein, ur: 30 mg/dL — AB
Specific Gravity, Urine: 1.017 (ref 1.005–1.030)
WBC, UA: >50 WBC/hpf — ABNORMAL HIGH (ref 0–5)
pH: 7 (ref 5.0–8.0)

## 2019-12-02 LAB — CBC WITH DIFFERENTIAL/PLATELET
Abs Immature Granulocytes: 0.04 10*3/uL (ref 0.00–0.07)
Basophils Absolute: 0 10*3/uL (ref 0.0–0.1)
Basophils Relative: 0 %
Eosinophils Absolute: 0 10*3/uL (ref 0.0–0.5)
Eosinophils Relative: 0 %
HCT: 30.5 % — ABNORMAL LOW (ref 36.0–46.0)
Hemoglobin: 10.6 g/dL — ABNORMAL LOW (ref 12.0–15.0)
Immature Granulocytes: 0 %
Lymphocytes Relative: 11 %
Lymphs Abs: 1 10*3/uL (ref 0.7–4.0)
MCH: 31.9 pg (ref 26.0–34.0)
MCHC: 34.8 g/dL (ref 30.0–36.0)
MCV: 91.9 fL (ref 80.0–100.0)
Monocytes Absolute: 1 10*3/uL (ref 0.1–1.0)
Monocytes Relative: 10 %
Neutro Abs: 7.2 10*3/uL (ref 1.7–7.7)
Neutrophils Relative %: 79 %
Platelets: 89 10*3/uL — ABNORMAL LOW (ref 150–400)
RBC: 3.32 MIL/uL — ABNORMAL LOW (ref 3.87–5.11)
RDW: 12.8 % (ref 11.5–15.5)
WBC: 9.2 10*3/uL (ref 4.0–10.5)
nRBC: 0 % (ref 0.0–0.2)

## 2019-12-02 LAB — COMPREHENSIVE METABOLIC PANEL
ALT: 13 U/L (ref 0–44)
AST: 22 U/L (ref 15–41)
Albumin: 3.1 g/dL — ABNORMAL LOW (ref 3.5–5.0)
Alkaline Phosphatase: 149 U/L — ABNORMAL HIGH (ref 38–126)
Anion gap: 11 (ref 5–15)
BUN: <5 mg/dL — ABNORMAL LOW (ref 6–20)
CO2: 18 mmol/L — ABNORMAL LOW (ref 22–32)
Calcium: 8.8 mg/dL — ABNORMAL LOW (ref 8.9–10.3)
Chloride: 104 mmol/L (ref 98–111)
Creatinine, Ser: 0.63 mg/dL (ref 0.44–1.00)
GFR, Estimated: >60 mL/min (ref >60)
Glucose, Bld: 73 mg/dL (ref 70–99)
Potassium: 3.5 mmol/L (ref 3.5–5.1)
Sodium: 133 mmol/L — ABNORMAL LOW (ref 135–145)
Total Bilirubin: 0.7 mg/dL (ref 0.3–1.2)
Total Protein: 6.5 g/dL (ref 6.5–8.1)

## 2019-12-02 MED ORDER — TAMSULOSIN HCL 0.4 MG PO CAPS
0.4000 mg | ORAL_CAPSULE | Freq: Every day | ORAL | Status: DC
  Administered 2019-12-02: 0.4 mg via ORAL
  Filled 2019-12-02: qty 1

## 2019-12-02 MED ORDER — OXYCODONE-ACETAMINOPHEN 5-325 MG PO TABS: 1.0000 | ORAL_TABLET | Freq: Once | ORAL | 0 refills | Status: AC

## 2019-12-02 MED ORDER — TAMSULOSIN HCL 0.4 MG PO CAPS: 0.4000 mg | ORAL_CAPSULE | Freq: Every day | ORAL | 0 refills | Status: AC

## 2019-12-02 MED ORDER — OXYCODONE-ACETAMINOPHEN 5-325 MG PO TABS
2.0000 | ORAL_TABLET | Freq: Once | ORAL | Status: AC
  Administered 2019-12-02: 2 via ORAL
  Filled 2019-12-02: qty 2

## 2019-12-02 MED ORDER — HYDROMORPHONE HCL 1 MG/ML IJ SOLN
1.0000 mg | Freq: Once | INTRAMUSCULAR | Status: AC
  Administered 2019-12-02: 1 mg via INTRAVENOUS
  Filled 2019-12-02: qty 1

## 2019-12-02 MED ORDER — TAMSULOSIN HCL 0.4 MG PO CAPS: 0.8000 mg | ORAL_CAPSULE | Freq: Every day | ORAL | Status: DC

## 2019-12-02 MED ORDER — LACTATED RINGERS IV BOLUS
1000.0000 mL | Freq: Once | INTRAVENOUS | Status: AC
  Administered 2019-12-02: 1000 mL via INTRAVENOUS

## 2019-12-02 NOTE — MAU Note (Signed)
Pt has been having back pain for several weeks was told it is normal in pregnancy. Pt feels it might be her kidneys. Pain is mostly in her left side and runs down left leg but can be on right side sometimes as well. Denies any pain or burning when she urinates. Told she had some blood in her urine when she went to OBGYN today. Told to come to MAU to r/o kidney stones.

## 2019-12-02 NOTE — Discharge Instructions (Signed)
Kidney Stones  Kidney stones are solid, rock-like deposits that form inside of the kidneys. The kidneys are a pair of organs that make urine. A kidney stone may form in a kidney and move into other parts of the urinary tract, including the tubes that connect the kidneys to the bladder (ureters), the bladder, and the tube that carries urine out of the body (urethra). As the stone moves through these areas, it can cause intense pain and block the flow of urine. Kidney stones are created when high levels of certain minerals are found in the urine. The stones are usually passed out of the body through urination, but in some cases, medical treatment may be needed to remove them. What are the causes? Kidney stones may be caused by:  A condition in which certain glands produce too much parathyroid hormone (primary hyperparathyroidism), which causes too much calcium buildup in the blood.  A buildup of uric acid crystals in the bladder (hyperuricosuria). Uric acid is a chemical that the body produces when you eat certain foods. It usually exits the body in the urine.  Narrowing (stricture) of one or both of the ureters.  A kidney blockage that is present at birth (congenital obstruction).  Past surgery on the kidney or the ureters, such as gastric bypass surgery. What increases the risk? The following factors may make you more likely to develop this condition:  Having had a kidney stone in the past.  Having a family history of kidney stones.  Not drinking enough water.  Eating a diet that is high in protein, salt (sodium), or sugar.  Being overweight or obese. What are the signs or symptoms? Symptoms of a kidney stone may include:  Pain in the side of the abdomen, right below the ribs (flank pain). Pain usually spreads (radiates) to the groin.  Needing to urinate frequently or urgently.  Painful urination.  Blood in the urine (hematuria).  Nausea.  Vomiting.  Fever and chills. How  is this diagnosed? This condition may be diagnosed based on:  Your symptoms and medical history.  A physical exam.  Blood tests.  Urine tests. These may be done before and after the stone passes out of your body through urination.  Imaging tests, such as a CT scan, abdominal X-ray, or ultrasound.  A procedure to examine the inside of the bladder (cystoscopy). How is this treated? Treatment for kidney stones depends on the size, location, and makeup of the stones. Kidney stones will often pass out of the body through urination. You may need to:  Increase your fluid intake to help pass the stone. In some cases, you may be given fluids through an IV and may need to be monitored at the hospital.  Take medicine for pain.  Make changes in your diet to help prevent kidney stones from coming back. Sometimes, medical procedures are needed to remove a kidney stone. This may involve:  A procedure to break up kidney stones using: ? A focused beam of light (laser therapy). ? Shock waves (extracorporeal shock wave lithotripsy).  Surgery to remove kidney stones. This may be needed if you have severe pain or have stones that block your urinary tract. Follow these instructions at home: Medicines  Take over-the-counter and prescription medicines only as told by your health care provider.  Ask your health care provider if the medicine prescribed to you requires you to avoid driving or using heavy machinery. Eating and drinking  Drink enough fluid to keep your urine pale yellow.   You may be instructed to drink at least 8-10 glasses of water each day. This will help you pass the kidney stone.  If directed, change your diet. This may include: ? Limiting how much sodium you eat. ? Eating more fruits and vegetables. ? Limiting how much animal protein--such as red meat, poultry, fish, and eggs--you eat.  Follow instructions from your health care provider about eating or drinking  restrictions. General instructions  Collect urine samples as told by your health care provider. You may need to collect a urine sample: ? 24 hours after you pass the stone. ? 8-12 weeks after passing the kidney stone, and every 6-12 months after that.  Strain your urine every time you urinate, for as long as directed. Use the strainer that your health care provider recommends.  Do not throw out the kidney stone after passing it. Keep the stone so it can be tested by your health care provider. Testing the makeup of your kidney stone may help prevent you from getting kidney stones in the future.  Keep all follow-up visits as told by your health care provider. This is important. You may need follow-up X-rays or ultrasounds to make sure that your stone has passed. How is this prevented? To prevent another kidney stone:  Drink enough fluid to keep your urine pale yellow. This is the best way to prevent kidney stones.  Eat a healthy diet and follow recommendations from your health care provider about foods to avoid. You may be instructed to eat a low-protein diet. Recommendations vary depending on the type of kidney stone that you have.  Maintain a healthy weight. Where to find more information  National Kidney Foundation (NKF): www.kidney.org  [facility]): www.urologyhealth.org Contact a health care provider if:  You have pain that gets worse or does not get better with medicine. Get help right away if:  You have a fever or chills.  You develop severe pain.  You develop new abdominal pain.  You faint.  You are unable to urinate. Summary  Kidney stones are solid, rock-like deposits that form inside of the kidneys.  Kidney stones can cause nausea, vomiting, blood in the urine, abdominal pain, and the urge to urinate frequently.  Treatment for kidney stones depends on the size, location, and makeup of the stones. Kidney stones will often pass out of the body  through urination.  Kidney stones can be prevented by drinking enough fluids, eating a healthy diet, and maintaining a healthy weight. This information is not intended to replace advice given to you by your health care provider. Make sure you discuss any questions you have with your health care provider. Document Revised: 06/10/2018 Document Reviewed: 06/10/2018 Elsevier Patient Education  2020 [employer].        Preterm Labor and Birth Information  The normal length of a pregnancy is 39-41 weeks. Preterm labor is when labor starts before 37 completed weeks of pregnancy. What are the risk factors for preterm labor? Preterm labor is more likely to occur in women who:  Have certain infections during pregnancy such as a bladder infection, sexually transmitted infection, or infection inside the uterus (chorioamnionitis).  Have a shorter-than-normal cervix.  Have gone into preterm labor before.  Have had surgery on their cervix.  Are younger than age 48 or older than age 27.  Are African American.  Are pregnant with twins or multiple babies (multiple gestation).  Take street drugs or smoke while pregnant.  Do not gain enough weight  while pregnant.  Became pregnant shortly after having been pregnant. What are the symptoms of preterm labor? Symptoms of preterm labor include:  Cramps similar to those that can happen during a menstrual period. The cramps may happen with diarrhea.  Pain in the abdomen or lower back.  Regular uterine contractions that may feel like tightening of the abdomen.  A feeling of increased pressure in the pelvis.  Increased watery or bloody mucus discharge from the vagina.  Water breaking (ruptured amniotic sac). Why is it important to recognize signs of preterm labor? It is important to recognize signs of preterm labor because babies who are born prematurely may not be fully developed. This can put them at an increased risk for:  Long-term  (chronic) heart and lung problems.  Difficulty immediately after birth with regulating body systems, including blood sugar, body temperature, heart rate, and breathing rate.  Bleeding in the brain.  Cerebral palsy.  Learning difficulties.  Death. These risks are highest for babies who are born before 34 weeks of pregnancy. How is preterm labor treated? Treatment depends on the length of your pregnancy, your condition, and the health of your baby. It may involve:  Having a stitch (suture) placed in your cervix to prevent your cervix from opening too early (cerclage).  Taking or being given medicines, such as: ? Hormone medicines. These may be given early in pregnancy to help support the pregnancy. ? Medicine to stop contractions. ? Medicines to help mature the baby's lungs. These may be prescribed if the risk of delivery is high. ? Medicines to prevent your baby from developing cerebral palsy. If the labor happens before 34 weeks of pregnancy, you may need to stay in the hospital. What should I do if I think I am in preterm labor? If you think that you are going into preterm labor, call your health care provider right away. How can I prevent preterm labor in future pregnancies? To increase your chance of having a full-term pregnancy:  Do not use any tobacco products, such as cigarettes, chewing tobacco, and e-cigarettes. If you need help quitting, ask your health care provider.  Do not use street drugs or medicines that have not been prescribed to you during your pregnancy.  Talk with your health care provider before taking any herbal supplements, even if you have been taking them regularly.  Make sure you gain a healthy amount of weight during your pregnancy.  Watch for infection. If you think that you might have an infection, get it checked right away.  Make sure to tell your health care provider if you have gone into preterm labor before. This information is not intended to  replace advice given to you by your health care provider. Make sure you discuss any questions you have with your health care provider. Document Revised: 05/16/2018 Document Reviewed: 06/15/2015 Elsevier Patient Education  2020 [employer].

## 2019-12-02 NOTE — MAU Provider Note (Addendum)
History     CSN: [id number]  Arrival date and time: 12/02/19 1643   First Provider Initiated Contact with Patient 12/02/19 1802      Chief Complaint  Patient presents with  . Back Pain   HPI  Christie Fowler is a 19 y.o. female G1P0 @ [redacted]w[redacted]d here with left sided abdominal pain. The pain started last night around 10 pm. She took tylenol which did not help the pain; only gave her relief for 10 mins. No vaginal bleeding. Hx of kidney infection, no history of kidney stones. The pain is constant and throbbing. She rates her pain 9/10. She was seen in the office today and was sent her for further evaluation of possible kidney stone. Dr. Ellyn Hack called prior to her arrival to MAU.  OB History    Gravida  1   Para      Term      Preterm      AB      Living        SAB      TAB      Ectopic      Multiple      Live Births              Past Medical History:  Diagnosis Date  . Medical history non-contributory     Past Surgical History:  Procedure Laterality Date  . NO PAST SURGERIES      Family History  Problem Relation Age of Onset  . Hypertension Father   . Diabetes Paternal Grandmother     Social History   Tobacco Use  . Smoking status: Never Smoker  . Smokeless tobacco: Never Used  Substance Use Topics  . Alcohol use: Never  . Drug use: Never    Allergies: No Known Allergies  Medications Prior to Admission  Medication Sig Dispense Refill Last Dose  . Doxylamine-Pyridoxine (DICLEGIS PO) Take by mouth.     . ferrous sulfate 325 (65 FE) MG tablet Take 325 mg by mouth daily with breakfast.     . Prenatal Vit-Fe Fumarate-FA (PRENATAL MULTIVITAMIN) TABS tablet Take 1 tablet by mouth daily at 12 noon.       Review of Systems  Constitutional: Negative for fever.  Gastrointestinal: Negative for nausea and vomiting.  Genitourinary: Negative for dysuria, vaginal bleeding and vaginal discharge.   Physical Exam   Blood pressure 136/82, pulse 85, temperature  99.2 F (37.3 C), resp. rate 18, last menstrual period 11/11/2018.  Patient Vitals for the past 24 hrs:  BP Temp Pulse Resp  12/02/19 1657 136/82 99.2 F (37.3 C) 85 18    Physical Exam Constitutional:      General: She is not in acute distress.    Appearance: Normal appearance. She is not ill-appearing, toxic-appearing or diaphoretic.  Abdominal:     Palpations: Abdomen is soft.     Tenderness: There is abdominal tenderness in the epigastric area, periumbilical area, left upper quadrant and left lower quadrant. There is no right CVA tenderness or left CVA tenderness.    Neurological:     Mental Status: She is alert.    Fetal Tracing: Baseline: 130 bpm Variability: Moderate  Accelerations: 15x15 Decelerations: None Toco: None  Results for orders placed or performed during the hospital encounter of 12/02/19 (from the past 24 hour(s))  Urinalysis, Routine w reflex microscopic Urine, Clean Catch     Status: Abnormal   Collection Time: 12/02/19  6:11 PM  Result Value Ref Range   Color, Urine YELLOW  YELLOW   APPearance HAZY (A) CLEAR   Specific Gravity, Urine 1.017 1.005 - 1.030   pH 7.0 5.0 - 8.0   Glucose, UA NEGATIVE NEGATIVE mg/dL   Hgb urine dipstick SMALL (A) NEGATIVE   Bilirubin Urine NEGATIVE NEGATIVE   Ketones, ur NEGATIVE NEGATIVE mg/dL   Protein, ur 30 (A) NEGATIVE mg/dL   Nitrite NEGATIVE NEGATIVE   Leukocytes,Ua SMALL (A) NEGATIVE   RBC / HPF 21-50 0 - 5 RBC/hpf   WBC, UA >50 (H) 0 - 5 WBC/hpf   Bacteria, UA FEW (A) NONE SEEN   Squamous Epithelial / LPF 11-20 0 - 5   Mucus PRESENT    Non Squamous Epithelial 0-5 (A) NONE SEEN  CBC with Differential/Platelet     Status: Abnormal   Collection Time: 12/02/19  6:26 PM  Result Value Ref Range   WBC 9.2 4.0 - 10.5 K/uL   RBC 3.32 (L) 3.87 - 5.11 MIL/uL   Hemoglobin 10.6 (L) 12.0 - 15.0 g/dL   HCT 79.0 (L) 36 - 46 %   MCV 91.9 80.0 - 100.0 fL   MCH 31.9 26.0 - 34.0 pg   MCHC 34.8 30.0 - 36.0 g/dL   RDW 24.0  97.3 - 53.2 %   Platelets 89 (L) 150 - 400 K/uL   nRBC 0.0 0.0 - 0.2 %   Neutrophils Relative % 79 %   Neutro Abs 7.2 1.7 - 7.7 K/uL   Lymphocytes Relative 11 %   Lymphs Abs 1.0 0.7 - 4.0 K/uL   Monocytes Relative 10 %   Monocytes Absolute 1.0 0.1 - 1.0 K/uL   Eosinophils Relative 0 %   Eosinophils Absolute 0.0 0.0 - 0.5 K/uL   Basophils Relative 0 %   Basophils Absolute 0.0 0.0 - 0.1 K/uL   Immature Granulocytes 0 %   Abs Immature Granulocytes 0.04 0.00 - 0.07 K/uL  Comprehensive metabolic panel     Status: Abnormal   Collection Time: 12/02/19  6:26 PM  Result Value Ref Range   Sodium 133 (L) 135 - 145 mmol/L   Potassium 3.5 3.5 - 5.1 mmol/L   Chloride 104 98 - 111 mmol/L   CO2 18 (L) 22 - 32 mmol/L   Glucose, Bld 73 70 - 99 mg/dL   BUN <5 (L) 6 - 20 mg/dL   Creatinine, Ser 9.92 0.44 - 1.00 mg/dL   Calcium 8.8 (L) 8.9 - 10.3 mg/dL   Total Protein 6.5 6.5 - 8.1 g/dL   Albumin 3.1 (L) 3.5 - 5.0 g/dL   AST 22 15 - 41 U/L   ALT 13 0 - 44 U/L   Alkaline Phosphatase 149 (H) 38 - 126 U/L   Total Bilirubin 0.7 0.3 - 1.2 mg/dL   GFR, Estimated >42 >68 mL/min   Anion gap 11 5 - 15   US RENAL  Result Date: 12/02/2019 CLINICAL DATA:  Hematuria [redacted] weeks pregnant EXAM: RENAL / URINARY TRACT ULTRASOUND COMPLETE COMPARISON:  None. FINDINGS: Right Kidney: Renal measurements: 9.6 x 5.2 x 5.2 cm = volume: 135.8 mL. Echogenicity within normal limits. No mass or hydronephrosis visualized. Left Kidney: Renal measurements: 10.4 x 3.9 x 4.6 cm = volume: 98.7 mL. Echogenicity within normal limits. No mass or hydronephrosis visualized. Bladder: Appears normal for degree of bladder distention. Other: None. IMPRESSION: Negative examination Electronically Signed   By: Jasmine Pang M.D.   On: 12/02/2019 19:04    MAU Course  Procedures  None  MDM  LR bolus X 1 Dilaudid 1  mg IV; pain now 0/10 CBC with diff. & CMP  Renal US negative. Patient rates pain 9/10, negative CVA tenderness. Considering a  distil stone. Will trial oral percocet and flomax. If continues to have improvement of pain will consider DC home with close f/u. Report given to Donia Ast NP who resumes care of the patient.  Percocet and flomax ordered. -UA: hazy/sm hgb/30 PRO/smleuks/few bacteria,sending urine for culture -CBC: platelets 89 -CMP: no abnormalities requiring treatment -[place]: WNL -called and notified Dr. Ellyn Hack of patient status and lab and [place] results, specifically thrombocytopenia. Dr. Ellyn Hack reports office is aware of thrombocytopenia and is following with labs. Dr. Ellyn Hack sent message to office to schedule pt on Monday/Tuesday of next week, and requested patient call office tomorrow to schedule. -pt discharged to home in stable condition, pain score 0  Orders Placed This Encounter  Procedures  . Culture, OB Urine    Standing Status:   Standing    Number of Occurrences:   1  . US RENAL    Standing Status:   Standing    Number of Occurrences:   1    Order Specific Question:   Symptom/Reason for Exam    Answer:   Hematuria [[id number]]  . Urinalysis, Routine w reflex microscopic Urine, Clean Catch    Standing Status:   Standing    Number of Occurrences:   1  . CBC with Differential/Platelet    Standing Status:   Standing    Number of Occurrences:   1  . Comprehensive metabolic panel    Standing Status:   Standing    Number of Occurrences:   1  . Ambulatory referral to Urology    Referral Priority:   Routine    Referral Type:   Consultation    Referral Reason:   Specialty Services Required    Requested Specialty:   Urology    Number of Visits Requested:   1  . Discharge patient    Order Specific Question:   Discharge disposition    Answer:   01-Home or Self Care [1]    Order Specific Question:   Discharge patient date    Answer:   12/02/2019   Meds ordered this encounter  Medications  . lactated ringers bolus 1,000 mL  . HYDROmorphone (DILAUDID) injection 1 mg  . oxyCODONE-acetaminophen  (PERCOCET/ROXICET) 5-325 MG per tablet 2 tablet  . DISCONTD: tamsulosin (FLOMAX) capsule 0.8 mg  . tamsulosin (FLOMAX) capsule 0.4 mg  . tamsulosin (FLOMAX) 0.4 MG CAPS capsule    Sig: Take 1 capsule (0.4 mg total) by mouth daily.    Dispense:  30 capsule    Refill:  0    Order Specific Question:   Supervising Provider    Answer:   Adam Phenix [[id number]]  . oxyCODONE-acetaminophen (PERCOCET/ROXICET) 5-325 MG tablet    Sig: Take 1-2 tablets by mouth once for 1 dose.    Dispense:  8 tablet    Refill:  0    Order Specific Question:   Supervising Provider    Answer:   Adam Phenix [[id number]]    Assessment and Plan   1. Left flank pain   2. Hematuria   3. [redacted] weeks gestation of pregnancy   4. NST (non-stress test) reactive     Allergies as of 12/02/2019   No Known Allergies     Medication List    TAKE these medications   DICLEGIS PO Take by mouth.   ferrous sulfate 325 (65 FE) MG  tablet Take 325 mg by mouth daily with breakfast.   oxyCODONE-acetaminophen 5-325 MG tablet Commonly known as: PERCOCET/ROXICET Take 1-2 tablets by mouth once for 1 dose.   prenatal multivitamin Tabs tablet Take 1 tablet by mouth daily at 12 noon.   tamsulosin 0.4 MG Caps capsule Commonly known as: FLOMAX Take 1 capsule (0.4 mg total) by mouth daily.      -will call with culture results, if requiring treatment -RX Flomax -RX Percocet #8 -urine strainer given -consult to urology made -return MAU precautions given -pt discharged to home in stable condition  Jinan Biggins, Odie SeraNicole E, NP  10:31 PM 12/02/2019

## 2019-12-05 LAB — CULTURE, OB URINE: Culture: 60000 — AB

## 2019-12-06 ENCOUNTER — Telehealth: Payer: Self-pay | Admitting: Women's Health

## 2019-12-06 NOTE — Telephone Encounter (Signed)
Attempt x1 to notify patient of +UTI. Called number, no answer, voicemail not set-up. Patient does not have MyChart.  Marylen Ponto, NP  1:29 PM 12/06/2019

## 2019-12-07 DIAGNOSIS — D696 Thrombocytopenia, unspecified: Secondary | ICD-10-CM | POA: Diagnosis not present

## 2019-12-07 DIAGNOSIS — Z419 Encounter for procedure for purposes other than remedying health state, unspecified: Secondary | ICD-10-CM | POA: Diagnosis not present

## 2019-12-15 DIAGNOSIS — O365931 Maternal care for other known or suspected poor fetal growth, third trimester, fetus 1: Secondary | ICD-10-CM | POA: Diagnosis not present

## 2019-12-15 DIAGNOSIS — Z3A30 30 weeks gestation of pregnancy: Secondary | ICD-10-CM | POA: Diagnosis not present

## 2019-12-23 DIAGNOSIS — Z3A31 31 weeks gestation of pregnancy: Secondary | ICD-10-CM | POA: Diagnosis not present

## 2019-12-23 DIAGNOSIS — O365930 Maternal care for other known or suspected poor fetal growth, third trimester, not applicable or unspecified: Secondary | ICD-10-CM | POA: Diagnosis not present

## 2020-01-04 DIAGNOSIS — Z3A33 33 weeks gestation of pregnancy: Secondary | ICD-10-CM | POA: Diagnosis not present

## 2020-01-04 DIAGNOSIS — O365931 Maternal care for other known or suspected poor fetal growth, third trimester, fetus 1: Secondary | ICD-10-CM | POA: Diagnosis not present

## 2020-01-04 DIAGNOSIS — D696 Thrombocytopenia, unspecified: Secondary | ICD-10-CM | POA: Diagnosis not present

## 2020-01-06 ENCOUNTER — Telehealth: Payer: Self-pay | Admitting: Hematology

## 2020-01-06 DIAGNOSIS — Z419 Encounter for procedure for purposes other than remedying health state, unspecified: Secondary | ICD-10-CM | POA: Diagnosis not present

## 2020-01-06 NOTE — Telephone Encounter (Signed)
Received a new hem referral from Dr. Hassell Done at [facility]. Pt has been cld and scheduled to see Dr. Candise Che on 12/9 at 240pm. Pt aware to arrive 30 minutes early.

## 2020-01-11 DIAGNOSIS — O365931 Maternal care for other known or suspected poor fetal growth, third trimester, fetus 1: Secondary | ICD-10-CM | POA: Diagnosis not present

## 2020-01-11 DIAGNOSIS — Z3A34 34 weeks gestation of pregnancy: Secondary | ICD-10-CM | POA: Diagnosis not present

## 2020-01-11 DIAGNOSIS — O0933 Supervision of pregnancy with insufficient antenatal care, third trimester: Secondary | ICD-10-CM | POA: Diagnosis not present

## 2020-01-14 ENCOUNTER — Inpatient Hospital Stay: Payer: Medicaid Other | Attending: Hematology | Admitting: Hematology

## 2020-01-14 NOTE — Progress Notes (Incomplete)
HEMATOLOGY/ONCOLOGY CONSULTATION NOTE  Date of Service: 01/14/2020  Patient Care Team: Patient, No Pcp Per as PCP - General (General Practice)  CHIEF COMPLAINTS/PURPOSE OF CONSULTATION:  Thrombocytopenia in pregnancy  HISTORY OF PRESENTING ILLNESS:  Christie Fowler is a wonderful 19 y.o. female who has been referred to [place] by Dr. Hassell Done for evaluation and management of thrombocytopenia in pregnancy. Pt is accompanied today by ***. The pt reports that she is doing well overall.   The pt reports *** 34 weeks 3 days pregnant   Of note prior to the patient's visit today, pt has had *** completed on *** with results revealing ***.   Most recent lab results (***) of CBC is as follows: all values are WNL except for ***.  On review of systems, pt reports *** and denies *** and any other symptoms.   On PMHx the pt reports ***. On Social Hx the pt reports *** On Family Hx the pt reports ***  A: -Discussed patient's most recent labs from ***, *** -***  MEDICAL HISTORY:  Past Medical History:  Diagnosis Date  . Medical history non-contributory     SURGICAL HISTORY: Past Surgical History:  Procedure Laterality Date  . NO PAST SURGERIES      SOCIAL HISTORY: Social History   Socioeconomic History  . Marital status: Single    Spouse name: Not on file  . Number of children: Not on file  . Years of education: Not on file  . Highest education level: Not on file  Occupational History  . Not on file  Tobacco Use  . Smoking status: Never Smoker  . Smokeless tobacco: Never Used  Substance and Sexual Activity  . Alcohol use: Never  . Drug use: Never  . Sexual activity: Yes  Other Topics Concern  . Not on file  Social History Narrative  . Not on file   Social Determinants of Health   Financial Resource Strain: Not on file  Food Insecurity: Not on file  Transportation Needs: Not on file  Physical Activity: Not on file  Stress: Not on file  Social Connections: Not on  file  Intimate Partner Violence: Not on file    FAMILY HISTORY: Family History  Problem Relation Age of Onset  . Hypertension Father   . Diabetes Paternal Grandmother     ALLERGIES:  has No Known Allergies.  MEDICATIONS:  Current Outpatient Medications  Medication Sig Dispense Refill  . Doxylamine-Pyridoxine (DICLEGIS PO) Take by mouth.    . ferrous sulfate 325 (65 FE) MG tablet Take 325 mg by mouth daily with breakfast.    . Prenatal Vit-Fe Fumarate-FA (PRENATAL MULTIVITAMIN) TABS tablet Take 1 tablet by mouth daily at 12 noon.     No current facility-administered medications for this visit.    REVIEW OF SYSTEMS:    10 Point review of Systems was done is negative except as noted above.  PHYSICAL EXAMINATION: ECOG PERFORMANCE STATUS: {CHL ONC ECOG YT:[id number]}  .There were no vitals filed for this visit. There were no vitals filed for this visit. .There is no height or weight on file to calculate BMI.  *** GENERAL:alert, in no acute distress and comfortable SKIN: no acute rashes, no significant lesions EYES: conjunctiva are pink and non-injected, sclera anicteric OROPHARYNX: MMM, no exudates, no oropharyngeal erythema or ulceration NECK: supple, no JVD LYMPH:  no palpable lymphadenopathy in the cervical, axillary or inguinal regions LUNGS: clear to auscultation b/l with normal respiratory effort HEART: regular rate & rhythm ABDOMEN:  normoactive bowel sounds , non tender, not distended. Extremity: no pedal edema PSYCH: alert & oriented x 3 with fluent speech NEURO: no focal motor/sensory deficits  LABORATORY DATA:  I have reviewed the data as listed  . CBC Latest Ref Rng & Units 12/02/2019 10/20/2019 11/18/2018  WBC 4.0 - 10.5 K/uL 9.2 6.8 8.4  Hemoglobin 12.0 - 15.0 g/dL 10.6(L) 11.3(L) 12.1  Hematocrit 36.0 - 46.0 % 30.5(L) 34.1(L) 37.3  Platelets 150 - 400 K/uL 89(L) 121(L) 129(L)    . CMP Latest Ref Rng & Units 12/02/2019 10/20/2019 11/18/2018  Glucose  70 - 99 mg/dL 73 74 756(E)  BUN 6 - 20 mg/dL <3(P) 6 11  Creatinine 0.44 - 1.00 mg/dL 2.95 1.88 4.16  Sodium 135 - 145 mmol/L 133(L) 134(L) 138  Potassium 3.5 - 5.1 mmol/L 3.5 3.9 3.7  Chloride 98 - 111 mmol/L 104 102 105  CO2 22 - 32 mmol/L 18(L) 23 23  Calcium 8.9 - 10.3 mg/dL 6.0(Y) 9.1 9.9  Total Protein 6.5 - 8.1 g/dL 6.5 6.9 -  Total Bilirubin 0.3 - 1.2 mg/dL 0.7 0.9 -  Alkaline Phos 38 - 126 U/L 149(H) 80 -  AST 15 - 41 U/L 22 21 -  ALT 0 - 44 U/L 13 14 -     RADIOGRAPHIC STUDIES: I have personally reviewed the radiological images as listed and agreed with the findings in the report. No results found.  ASSESSMENT & PLAN:   PLAN: ***  FOLLOW UP: ***   All of the patients questions were answered with apparent satisfaction. The patient knows to call the clinic with any problems, questions or concerns.  I spent *** counseling the patient face to face. The total time spent in the appointment was *** and more than 50% was on counseling and direct patient cares.    Wyvonnia Lora MD MS AAHIVMS [facility] Hematology/Oncology Physician [facility]  (Office):       [phone] (Work cell):  [phone] (Fax):           [phone]  01/14/2020 7:56 AM  I, Carollee Herter, am acting as a scribe for Dr. Wyvonnia Lora.   {Add Production assistant, radio Statement}

## 2020-01-25 DIAGNOSIS — Z3A36 36 weeks gestation of pregnancy: Secondary | ICD-10-CM | POA: Diagnosis not present

## 2020-01-25 DIAGNOSIS — O365931 Maternal care for other known or suspected poor fetal growth, third trimester, fetus 1: Secondary | ICD-10-CM | POA: Diagnosis not present

## 2020-01-25 DIAGNOSIS — O0933 Supervision of pregnancy with insufficient antenatal care, third trimester: Secondary | ICD-10-CM | POA: Diagnosis not present

## 2020-01-25 DIAGNOSIS — Z3685 Encounter for antenatal screening for Streptococcus B: Secondary | ICD-10-CM | POA: Diagnosis not present

## 2020-01-25 DIAGNOSIS — O365913 Maternal care for other known or suspected poor fetal growth, first trimester, fetus 3: Secondary | ICD-10-CM | POA: Diagnosis not present

## 2020-01-25 DIAGNOSIS — Z113 Encounter for screening for infections with a predominantly sexual mode of transmission: Secondary | ICD-10-CM | POA: Diagnosis not present

## 2020-02-01 ENCOUNTER — Other Ambulatory Visit: Payer: Self-pay

## 2020-02-01 ENCOUNTER — Inpatient Hospital Stay (HOSPITAL_COMMUNITY)
Admission: AD | Admit: 2020-02-01 | Discharge: 2020-02-04 | DRG: 786 | Disposition: A | Discharge status: Home / Self Care | Payer: Medicaid Other | Attending: Obstetrics and Gynecology | Admitting: Obstetrics and Gynecology

## 2020-02-01 ENCOUNTER — Encounter (HOSPITAL_COMMUNITY): Payer: Self-pay | Admitting: Obstetrics and Gynecology

## 2020-02-01 ENCOUNTER — Inpatient Hospital Stay (HOSPITAL_COMMUNITY): Payer: Medicaid Other | Admitting: Certified Registered"

## 2020-02-01 ENCOUNTER — Encounter (HOSPITAL_COMMUNITY)
Admission: AD | Disposition: A | Discharge status: Home / Self Care | Payer: Self-pay | Source: Home / Self Care | Attending: Obstetrics and Gynecology

## 2020-02-01 DIAGNOSIS — O99824 Streptococcus B carrier state complicating childbirth: Secondary | ICD-10-CM | POA: Diagnosis not present

## 2020-02-01 DIAGNOSIS — Z3A37 37 weeks gestation of pregnancy: Secondary | ICD-10-CM

## 2020-02-01 DIAGNOSIS — O9912 Other diseases of the blood and blood-forming organs and certain disorders involving the immune mechanism complicating childbirth: Principal | ICD-10-CM | POA: Diagnosis present

## 2020-02-01 DIAGNOSIS — U071 COVID-19: Secondary | ICD-10-CM | POA: Diagnosis present

## 2020-02-01 DIAGNOSIS — D6959 Other secondary thrombocytopenia: Secondary | ICD-10-CM | POA: Diagnosis not present

## 2020-02-01 DIAGNOSIS — O26893 Other specified pregnancy related conditions, third trimester: Secondary | ICD-10-CM | POA: Diagnosis not present

## 2020-02-01 DIAGNOSIS — O9852 Other viral diseases complicating childbirth: Secondary | ICD-10-CM | POA: Diagnosis not present

## 2020-02-01 DIAGNOSIS — B342 Coronavirus infection, unspecified: Secondary | ICD-10-CM | POA: Diagnosis not present

## 2020-02-01 DIAGNOSIS — O411230 Chorioamnionitis, third trimester, not applicable or unspecified: Secondary | ICD-10-CM | POA: Diagnosis not present

## 2020-02-01 DIAGNOSIS — O365930 Maternal care for other known or suspected poor fetal growth, third trimester, not applicable or unspecified: Secondary | ICD-10-CM | POA: Diagnosis present

## 2020-02-01 DIAGNOSIS — O1414 Severe pre-eclampsia complicating childbirth: Secondary | ICD-10-CM | POA: Diagnosis not present

## 2020-02-01 DIAGNOSIS — O368390 Maternal care for abnormalities of the fetal heart rate or rhythm, unspecified trimester, not applicable or unspecified: Secondary | ICD-10-CM | POA: Diagnosis not present

## 2020-02-01 LAB — TYPE AND SCREEN
ABO/RH(D): A POS
Antibody Screen: NEGATIVE

## 2020-02-01 LAB — COMPREHENSIVE METABOLIC PANEL
ALT: 17 U/L (ref 0–44)
AST: 31 U/L (ref 15–41)
Albumin: 2.3 g/dL — ABNORMAL LOW (ref 3.5–5.0)
Alkaline Phosphatase: 256 U/L — ABNORMAL HIGH (ref 38–126)
Anion gap: 10 (ref 5–15)
BUN: 9 mg/dL (ref 6–20)
CO2: 19 mmol/L — ABNORMAL LOW (ref 22–32)
Calcium: 9.1 mg/dL (ref 8.9–10.3)
Chloride: 108 mmol/L (ref 98–111)
Creatinine, Ser: 0.82 mg/dL (ref 0.44–1.00)
GFR, Estimated: >60 mL/min (ref >60)
Glucose, Bld: 67 mg/dL — ABNORMAL LOW (ref 70–99)
Potassium: 4.3 mmol/L (ref 3.5–5.1)
Sodium: 137 mmol/L (ref 135–145)
Total Bilirubin: 0.3 mg/dL (ref 0.3–1.2)
Total Protein: 6.1 g/dL — ABNORMAL LOW (ref 6.5–8.1)

## 2020-02-01 LAB — CBC
HCT: 37.5 % (ref 36.0–46.0)
Hemoglobin: 12.5 g/dL (ref 12.0–15.0)
MCH: 30.7 pg (ref 26.0–34.0)
MCHC: 33.3 g/dL (ref 30.0–36.0)
MCV: 92.1 fL (ref 80.0–100.0)
Platelets: 75 10*3/uL — ABNORMAL LOW (ref 150–400)
RBC: 4.07 MIL/uL (ref 3.87–5.11)
RDW: 14.2 % (ref 11.5–15.5)
WBC: 8.1 10*3/uL (ref 4.0–10.5)
nRBC: 0 % (ref 0.0–0.2)

## 2020-02-01 LAB — PROTEIN / CREATININE RATIO, URINE
Creatinine, Urine: 109.68 mg/dL
Protein Creatinine Ratio: 7.53 mg/mg{Cre} — ABNORMAL HIGH (ref 0.00–0.15)
Total Protein, Urine: 826 mg/dL

## 2020-02-01 LAB — MAGNESIUM: Magnesium: 6.6 mg/dL (ref 1.7–2.4)

## 2020-02-01 LAB — RESP PANEL BY RT-PCR (FLU A&B, COVID) ARPGX2
Influenza A by PCR: NEGATIVE
Influenza B by PCR: NEGATIVE
SARS Coronavirus 2 by RT PCR: POSITIVE — AB

## 2020-02-01 LAB — GLUCOSE, CAPILLARY: Glucose-Capillary: 70 mg/dL (ref 70–99)

## 2020-02-01 LAB — RPR: RPR Ser Ql: NONREACTIVE

## 2020-02-01 SURGERY — Surgical Case
Anesthesia: General | Wound class: Clean Contaminated

## 2020-02-01 MED ORDER — OXYTOCIN-SODIUM CHLORIDE 30-0.9 UT/500ML-% IV SOLN
INTRAVENOUS | Status: AC
  Filled 2020-02-01: qty 500

## 2020-02-01 MED ORDER — MEASLES, MUMPS & RUBELLA VAC IJ SOLR: 0.5000 mL | Freq: Once | INTRAMUSCULAR | Status: DC

## 2020-02-01 MED ORDER — DIPHENHYDRAMINE HCL 25 MG PO CAPS: 25.0000 mg | ORAL_CAPSULE | Freq: Four times a day (QID) | ORAL | Status: DC | PRN

## 2020-02-01 MED ORDER — AMISULPRIDE (ANTIEMETIC) 5 MG/2ML IV SOLN: 10.0000 mg | Freq: Once | INTRAVENOUS | Status: DC | PRN

## 2020-02-01 MED ORDER — HYDRALAZINE HCL 20 MG/ML IJ SOLN
10.0000 mg | INTRAMUSCULAR | Status: DC | PRN
  Filled 2020-02-01: qty 1

## 2020-02-01 MED ORDER — HYDROMORPHONE HCL 1 MG/ML IJ SOLN
INTRAMUSCULAR | Status: AC
  Filled 2020-02-01: qty 0.5

## 2020-02-01 MED ORDER — ALUM & MAG HYDROXIDE-SIMETH 200-200-20 MG/5ML PO SUSP
30.0000 mL | ORAL | Status: DC | PRN
  Administered 2020-02-03: 16:00:00 30 mL via ORAL
  Filled 2020-02-01: qty 30

## 2020-02-01 MED ORDER — FENTANYL CITRATE (PF) 100 MCG/2ML IJ SOLN
INTRAMUSCULAR | Status: AC
  Filled 2020-02-01: qty 2

## 2020-02-01 MED ORDER — OXYCODONE HCL 5 MG PO TABS
5.0000 mg | ORAL_TABLET | ORAL | Status: DC | PRN
  Administered 2020-02-02 (×2): 5 mg via ORAL
  Administered 2020-02-02: 10 mg via ORAL
  Administered 2020-02-02 – 2020-02-04 (×5): 5 mg via ORAL
  Filled 2020-02-01 (×7): qty 1
  Filled 2020-02-01: qty 2

## 2020-02-01 MED ORDER — SUCCINYLCHOLINE CHLORIDE 200 MG/10ML IV SOSY
PREFILLED_SYRINGE | INTRAVENOUS | Status: AC
  Filled 2020-02-01: qty 10

## 2020-02-01 MED ORDER — DIBUCAINE (PERIANAL) 1 % EX OINT: 1.0000 "application " | TOPICAL_OINTMENT | CUTANEOUS | Status: DC | PRN

## 2020-02-01 MED ORDER — OXYTOCIN-SODIUM CHLORIDE 30-0.9 UT/500ML-% IV SOLN: 2.5000 [IU]/h | INTRAVENOUS | Status: AC

## 2020-02-01 MED ORDER — LABETALOL HCL 5 MG/ML IV SOLN
80.0000 mg | INTRAVENOUS | Status: DC | PRN
  Administered 2020-02-01: 06:00:00 80 mg via INTRAVENOUS
  Filled 2020-02-01: qty 16

## 2020-02-01 MED ORDER — DEXAMETHASONE SODIUM PHOSPHATE 10 MG/ML IJ SOLN
INTRAMUSCULAR | Status: AC
  Filled 2020-02-01: qty 1

## 2020-02-01 MED ORDER — LIDOCAINE HCL (PF) 1 % IJ SOLN: 30.0000 mL | INTRAMUSCULAR | Status: DC | PRN

## 2020-02-01 MED ORDER — METHYLERGONOVINE MALEATE 0.2 MG PO TABS: 0.2000 mg | ORAL_TABLET | ORAL | Status: DC | PRN

## 2020-02-01 MED ORDER — OXYTOCIN-SODIUM CHLORIDE 30-0.9 UT/500ML-% IV SOLN
INTRAVENOUS | Status: DC | PRN
  Administered 2020-02-01: 300 mL via INTRAVENOUS

## 2020-02-01 MED ORDER — SOD CITRATE-CITRIC ACID 500-334 MG/5ML PO SOLN: 30.0000 mL | ORAL | Status: DC | PRN

## 2020-02-01 MED ORDER — HYDROMORPHONE HCL 1 MG/ML IJ SOLN: 1.0000 mg | INTRAMUSCULAR | Status: DC | PRN

## 2020-02-01 MED ORDER — ACETAMINOPHEN 500 MG PO TABS: 1000.0000 mg | ORAL_TABLET | Freq: Four times a day (QID) | ORAL | Status: DC

## 2020-02-01 MED ORDER — PROPOFOL 10 MG/ML IV BOLUS
INTRAVENOUS | Status: AC
  Filled 2020-02-01: qty 20

## 2020-02-01 MED ORDER — PROMETHAZINE HCL 25 MG/ML IJ SOLN
6.2500 mg | INTRAMUSCULAR | Status: DC | PRN
Start: 2020-02-01 — End: 2020-02-01

## 2020-02-01 MED ORDER — MEPERIDINE HCL 25 MG/ML IJ SOLN: 6.2500 mg | INTRAMUSCULAR | Status: DC | PRN

## 2020-02-01 MED ORDER — DEXAMETHASONE SODIUM PHOSPHATE 10 MG/ML IJ SOLN
INTRAMUSCULAR | Status: DC | PRN
  Administered 2020-02-01: 10 mg via INTRAVENOUS

## 2020-02-01 MED ORDER — ACETAMINOPHEN 325 MG PO TABS: 650.0000 mg | ORAL_TABLET | ORAL | Status: DC | PRN

## 2020-02-01 MED ORDER — TETANUS-DIPHTH-ACELL PERTUSSIS 5-2.5-18.5 LF-MCG/0.5 IM SUSY: 0.5000 mL | PREFILLED_SYRINGE | Freq: Once | INTRAMUSCULAR | Status: DC

## 2020-02-01 MED ORDER — OXYCODONE HCL 5 MG PO TABS
5.0000 mg | ORAL_TABLET | Freq: Once | ORAL | Status: DC | PRN
Start: 2020-02-01 — End: 2020-02-01

## 2020-02-01 MED ORDER — MIDAZOLAM HCL 2 MG/2ML IJ SOLN
INTRAMUSCULAR | Status: AC
  Filled 2020-02-01: qty 2

## 2020-02-01 MED ORDER — SCOPOLAMINE 1 MG/3DAYS TD PT72
MEDICATED_PATCH | TRANSDERMAL | Status: AC
  Filled 2020-02-01: qty 1

## 2020-02-01 MED ORDER — METHYLERGONOVINE MALEATE 0.2 MG/ML IJ SOLN: 0.2000 mg | INTRAMUSCULAR | Status: DC | PRN

## 2020-02-01 MED ORDER — OXYCODONE-ACETAMINOPHEN 5-325 MG PO TABS
2.0000 | ORAL_TABLET | ORAL | Status: DC | PRN
Start: 2020-02-01 — End: 2020-02-01

## 2020-02-01 MED ORDER — PENICILLIN G POT IN DEXTROSE 60000 UNIT/ML IV SOLN: 3.0000 10*6.[IU] | INTRAVENOUS | Status: DC

## 2020-02-01 MED ORDER — ONDANSETRON HCL 4 MG/2ML IJ SOLN
4.0000 mg | Freq: Three times a day (TID) | INTRAMUSCULAR | Status: DC | PRN
  Administered 2020-02-01: 14:00:00 4 mg via INTRAVENOUS
  Filled 2020-02-01: qty 2

## 2020-02-01 MED ORDER — CEFAZOLIN SODIUM-DEXTROSE 2-4 GM/100ML-% IV SOLN
INTRAVENOUS | Status: AC
  Filled 2020-02-01: qty 100

## 2020-02-01 MED ORDER — SODIUM CHLORIDE 0.9 % IV SOLN
5.0000 10*6.[IU] | Freq: Once | INTRAVENOUS | Status: AC
  Administered 2020-02-01: 07:00:00 5 10*6.[IU] via INTRAVENOUS
  Filled 2020-02-01: qty 5

## 2020-02-01 MED ORDER — LABETALOL HCL 5 MG/ML IV SOLN
20.0000 mg | INTRAVENOUS | Status: DC | PRN
  Administered 2020-02-01: 12:00:00 10 mg via INTRAVENOUS
  Administered 2020-02-01 – 2020-02-02 (×2): 20 mg via INTRAVENOUS
  Filled 2020-02-01 (×2): qty 4

## 2020-02-01 MED ORDER — ONDANSETRON HCL 4 MG/2ML IJ SOLN
INTRAMUSCULAR | Status: AC
  Filled 2020-02-01: qty 2

## 2020-02-01 MED ORDER — KETOROLAC TROMETHAMINE 30 MG/ML IJ SOLN: 30.0000 mg | Freq: Four times a day (QID) | INTRAMUSCULAR | Status: DC | PRN

## 2020-02-01 MED ORDER — PHENYLEPHRINE HCL-NACL 20-0.9 MG/250ML-% IV SOLN
INTRAVENOUS | Status: AC
  Filled 2020-02-01: qty 250

## 2020-02-01 MED ORDER — LABETALOL HCL 5 MG/ML IV SOLN
40.0000 mg | INTRAVENOUS | Status: DC | PRN
  Administered 2020-02-01: 06:00:00 40 mg via INTRAVENOUS
  Filled 2020-02-01: qty 8

## 2020-02-01 MED ORDER — OXYTOCIN-SODIUM CHLORIDE 30-0.9 UT/500ML-% IV SOLN
2.5000 [IU]/h | INTRAVENOUS | Status: DC
  Administered 2020-02-01: 10:00:00 30 [IU] via INTRAVENOUS
  Administered 2020-02-01: 11:00:00 2.5 [IU]/h via INTRAVENOUS

## 2020-02-01 MED ORDER — SIMETHICONE 80 MG PO CHEW
80.0000 mg | CHEWABLE_TABLET | ORAL | Status: DC | PRN
  Administered 2020-02-03: 80 mg via ORAL
  Filled 2020-02-01: qty 1

## 2020-02-01 MED ORDER — HYDROMORPHONE HCL 1 MG/ML IJ SOLN
0.2500 mg | INTRAMUSCULAR | Status: DC | PRN
  Administered 2020-02-01: 0.5 mg via INTRAVENOUS

## 2020-02-01 MED ORDER — KETOROLAC TROMETHAMINE 30 MG/ML IJ SOLN: 30.0000 mg | Freq: Four times a day (QID) | INTRAMUSCULAR | Status: AC

## 2020-02-01 MED ORDER — ONDANSETRON HCL 4 MG/2ML IJ SOLN: 4.0000 mg | Freq: Four times a day (QID) | INTRAMUSCULAR | Status: DC | PRN

## 2020-02-01 MED ORDER — MIDAZOLAM HCL 2 MG/2ML IJ SOLN
INTRAMUSCULAR | Status: DC | PRN
  Administered 2020-02-01: 2 mg via INTRAVENOUS

## 2020-02-01 MED ORDER — FENTANYL CITRATE (PF) 100 MCG/2ML IJ SOLN
INTRAMUSCULAR | Status: DC | PRN
  Administered 2020-02-01: 50 ug via INTRAVENOUS
  Administered 2020-02-01: 100 ug via INTRAVENOUS
  Administered 2020-02-01: 25 ug via INTRAVENOUS

## 2020-02-01 MED ORDER — SUCCINYLCHOLINE CHLORIDE 200 MG/10ML IV SOSY
PREFILLED_SYRINGE | INTRAVENOUS | Status: DC | PRN
  Administered 2020-02-01: 200 mg via INTRAVENOUS

## 2020-02-01 MED ORDER — LABETALOL HCL 5 MG/ML IV SOLN
INTRAVENOUS | Status: DC | PRN
  Administered 2020-02-01: 10 mg via INTRAVENOUS

## 2020-02-01 MED ORDER — ACETAMINOPHEN 10 MG/ML IV SOLN
INTRAVENOUS | Status: AC
  Filled 2020-02-01: qty 100

## 2020-02-01 MED ORDER — OXYTOCIN BOLUS FROM INFUSION: 333.0000 mL | Freq: Once | INTRAVENOUS | Status: DC

## 2020-02-01 MED ORDER — ZOLPIDEM TARTRATE 5 MG PO TABS: 5.0000 mg | ORAL_TABLET | Freq: Every evening | ORAL | Status: DC | PRN

## 2020-02-01 MED ORDER — LACTATED RINGERS IV SOLN: INTRAVENOUS | Status: DC

## 2020-02-01 MED ORDER — SCOPOLAMINE 1 MG/3DAYS TD PT72
1.0000 | MEDICATED_PATCH | Freq: Once | TRANSDERMAL | Status: AC
  Administered 2020-02-01: 12:00:00 1.5 mg via TRANSDERMAL

## 2020-02-01 MED ORDER — ACETAMINOPHEN 500 MG PO TABS
1000.0000 mg | ORAL_TABLET | Freq: Four times a day (QID) | ORAL | Status: DC
  Administered 2020-02-01 – 2020-02-04 (×12): 1000 mg via ORAL
  Filled 2020-02-01 (×12): qty 2

## 2020-02-01 MED ORDER — OXYCODONE HCL 5 MG/5ML PO SOLN: 5.0000 mg | Freq: Once | ORAL | Status: DC | PRN

## 2020-02-01 MED ORDER — SENNOSIDES-DOCUSATE SODIUM 8.6-50 MG PO TABS
2.0000 | ORAL_TABLET | ORAL | Status: DC
  Administered 2020-02-01 – 2020-02-03 (×3): 2 via ORAL
  Filled 2020-02-01 (×3): qty 2

## 2020-02-01 MED ORDER — IBUPROFEN 800 MG PO TABS
800.0000 mg | ORAL_TABLET | Freq: Three times a day (TID) | ORAL | Status: DC
  Administered 2020-02-03: 11:00:00 800 mg via ORAL
  Filled 2020-02-01 (×2): qty 1

## 2020-02-01 MED ORDER — BUTORPHANOL TARTRATE 1 MG/ML IJ SOLN
1.0000 mg | INTRAMUSCULAR | Status: DC | PRN
  Administered 2020-02-01: 1 mg via INTRAVENOUS
  Filled 2020-02-01: qty 1

## 2020-02-01 MED ORDER — PRENATAL MULTIVITAMIN CH
1.0000 | ORAL_TABLET | Freq: Every day | ORAL | Status: DC
  Administered 2020-02-02 – 2020-02-04 (×3): 1 via ORAL
  Filled 2020-02-01 (×3): qty 1

## 2020-02-01 MED ORDER — COCONUT OIL OIL
1.0000 "application " | TOPICAL_OIL | Status: DC | PRN
  Administered 2020-02-03: 1 via TOPICAL

## 2020-02-01 MED ORDER — WITCH HAZEL-GLYCERIN EX PADS: 1.0000 "application " | MEDICATED_PAD | CUTANEOUS | Status: DC | PRN

## 2020-02-01 MED ORDER — MAGNESIUM SULFATE 40 GM/1000ML IV SOLN
2.0000 g/h | INTRAVENOUS | Status: DC
  Administered 2020-02-02: 05:00:00 2 g/h via INTRAVENOUS
  Filled 2020-02-01 (×2): qty 1000

## 2020-02-01 MED ORDER — LACTATED RINGERS IV SOLN: 500.0000 mL | INTRAVENOUS | Status: DC | PRN

## 2020-02-01 MED ORDER — MAGNESIUM SULFATE BOLUS VIA INFUSION
6.0000 g | Freq: Once | INTRAVENOUS | Status: AC
  Administered 2020-02-01: 13:00:00 6 g via INTRAVENOUS
  Filled 2020-02-01: qty 1000

## 2020-02-01 MED ORDER — SODIUM CHLORIDE 0.9 % IR SOLN
Status: DC | PRN
  Administered 2020-02-01: 1

## 2020-02-01 MED ORDER — MENTHOL 3 MG MT LOZG: 1.0000 | LOZENGE | OROMUCOSAL | Status: DC | PRN

## 2020-02-01 MED ORDER — ACETAMINOPHEN 10 MG/ML IV SOLN
1000.0000 mg | Freq: Once | INTRAVENOUS | Status: DC | PRN
  Administered 2020-02-01: 1000 mg via INTRAVENOUS

## 2020-02-01 MED ORDER — OXYCODONE-ACETAMINOPHEN 5-325 MG PO TABS
1.0000 | ORAL_TABLET | ORAL | Status: DC | PRN
Start: 2020-02-01 — End: 2020-02-01

## 2020-02-01 MED ORDER — ONDANSETRON HCL 4 MG/2ML IJ SOLN
INTRAMUSCULAR | Status: DC | PRN
  Administered 2020-02-01: 4 mg via INTRAVENOUS

## 2020-02-01 MED ORDER — MAGNESIUM HYDROXIDE 400 MG/5ML PO SUSP: 30.0000 mL | ORAL | Status: DC | PRN

## 2020-02-01 MED ORDER — CEFAZOLIN SODIUM-DEXTROSE 2-3 GM-%(50ML) IV SOLR
INTRAVENOUS | Status: DC | PRN
  Administered 2020-02-01: 2 g via INTRAVENOUS

## 2020-02-01 MED ORDER — PROPOFOL 10 MG/ML IV BOLUS
INTRAVENOUS | Status: DC | PRN
  Administered 2020-02-01: 200 mg via INTRAVENOUS

## 2020-02-01 SURGICAL SUPPLY — 31 items
CHLORAPREP W/TINT 26ML (MISCELLANEOUS) ×3 IMPLANT
CLAMP CORD UMBIL (MISCELLANEOUS) IMPLANT
CLOTH BEACON ORANGE TIMEOUT ST (SAFETY) ×3 IMPLANT
DRSG OPSITE POSTOP 4X10 (GAUZE/BANDAGES/DRESSINGS) ×3 IMPLANT
ELECT REM PT RETURN 9FT ADLT (ELECTROSURGICAL) ×3
ELECTRODE REM PT RTRN 9FT ADLT (ELECTROSURGICAL) ×1 IMPLANT
EXTRACTOR VACUUM KIWI (MISCELLANEOUS) IMPLANT
EXTRACTOR VACUUM M CUP 4 TUBE (SUCTIONS) IMPLANT
EXTRACTOR VACUUM M CUP 4' TUBE (SUCTIONS)
GLOVE BIOGEL PI IND STRL 7.0 (GLOVE) ×1 IMPLANT
GLOVE BIOGEL PI INDICATOR 7.0 (GLOVE) ×2
GLOVE ORTHO TXT STRL SZ7.5 (GLOVE) ×3 IMPLANT
GOWN STRL REUS W/TWL LRG LVL3 (GOWN DISPOSABLE) ×6 IMPLANT
KIT ABG SYR 3ML LUER SLIP (SYRINGE) IMPLANT
NEEDLE HYPO 25X5/8 SAFETYGLIDE (NEEDLE) ×3 IMPLANT
NS IRRIG 1000ML POUR BTL (IV SOLUTION) ×3 IMPLANT
PACK C SECTION WH (CUSTOM PROCEDURE TRAY) ×3 IMPLANT
PAD OB MATERNITY 4.3X12.25 (PERSONAL CARE ITEMS) ×3 IMPLANT
PENCIL SMOKE EVAC W/HOLSTER (ELECTROSURGICAL) ×3 IMPLANT
RTRCTR C-SECT PINK 25CM LRG (MISCELLANEOUS) ×3 IMPLANT
STAPLER VISISTAT 35W (STAPLE) ×3 IMPLANT
SUT CHROMIC 1 CTX 36 (SUTURE) ×6 IMPLANT
SUT PLAIN 0 NONE (SUTURE) IMPLANT
SUT PLAIN 2 0 XLH (SUTURE) IMPLANT
SUT VIC AB 0 CT1 27 (SUTURE) ×9
SUT VIC AB 0 CT1 27XBRD ANBCTR (SUTURE) ×3 IMPLANT
SUT VIC AB 2-0 CT1 (SUTURE) ×6 IMPLANT
SUT VIC AB 4-0 KS 27 (SUTURE) IMPLANT
TOWEL OR 17X24 6PK STRL BLUE (TOWEL DISPOSABLE) ×3 IMPLANT
TRAY FOLEY W/BAG SLVR 14FR LF (SET/KITS/TRAYS/PACK) ×3 IMPLANT
WATER STERILE IRR 1000ML POUR (IV SOLUTION) ×3 IMPLANT

## 2020-02-01 NOTE — Anesthesia Preprocedure Evaluation (Signed)
Anesthesia Evaluation  Patient identified by MRN, date of birth, ID band Patient awake    Reviewed: Allergy & Precautions, H&P Preop documentation limited or incomplete due to emergent nature of procedure.  Airway Mallampati: II  TM Distance: >3 FB Neck ROM: Full    Dental no notable dental hx.    Pulmonary  Covid + pneumonia   Pulmonary exam normal        Cardiovascular negative cardio ROS Normal cardiovascular exam     Neuro/Psych negative neurological ROS  negative psych ROS   GI/Hepatic negative GI ROS, Neg liver ROS,   Endo/Other  negative endocrine ROS  Renal/GU negative Renal ROS  negative genitourinary   Musculoskeletal negative musculoskeletal ROS (+)   Abdominal   Peds negative pediatric ROS (+)  Hematology thromobcytopenia   Anesthesia Other Findings   Reproductive/Obstetrics negative OB ROS                             Anesthesia Physical Anesthesia Plan  ASA: III and emergent  Anesthesia Plan: General   Post-op Pain Management:    Induction:   PONV Risk Score and Plan: 3 and Scopolamine patch - Pre-op, Dexamethasone, Ondansetron and Treatment may vary due to age or medical condition  Airway Management Planned: Oral ETT and Video Laryngoscope Planned  Additional Equipment:   Intra-op Plan:   Post-operative Plan: Extubation in OR  Informed Consent:     Only emergency history available  Plan Discussed with: Anesthesiologist and CRNA  Anesthesia Plan Comments: (To OR emergently for fetal heart. No epidural in place. Plan for GETA/RSI with proper PPE due to Covid +. Tanna Furry, MD  )        Anesthesia Quick Evaluation

## 2020-02-01 NOTE — Progress Notes (Signed)
Attempted to take pt's temperature orally and axillary with 2 different thermometers and was unable to obtain readings. Took temperature rectally and obtained a reading of 93.3. Other vital signs stable and pt is alert and oriented x4. Notified Dr. Jackelyn Knife. Received verbal order to place a bear hugger on pt. Will continue to monitor.

## 2020-02-01 NOTE — Progress Notes (Signed)
Started on magnesium early this afternoon for persistent severe range BP requiring treatment, otherwise stable

## 2020-02-01 NOTE — Anesthesia Procedure Notes (Signed)
Procedure Name: Intubation Date/Time: 02/01/2020 9:48 AM Performed by: Lucinda Dell, CRNA Pre-anesthesia Checklist: Patient identified, Emergency Drugs available, Suction available and Patient being monitored Patient Re-evaluated:Patient Re-evaluated prior to induction Oxygen Delivery Method: Circle system utilized Preoxygenation: Pre-oxygenation with 100% oxygen Induction Type: IV induction, Rapid sequence and Cricoid Pressure applied Laryngoscope Size: Glidescope and 3 Tube type: Oral Tube size: 7.0 mm Number of attempts: 1 Airway Equipment and Method: Stylet and Video-laryngoscopy Placement Confirmation: ETT inserted through vocal cords under direct vision,  positive ETCO2 and breath sounds checked- equal and bilateral Secured at: 21 cm Tube secured with: Tape Dental Injury: Teeth and Oropharynx as per pre-operative assessment  Comments: Stat C/S

## 2020-02-01 NOTE — Progress Notes (Signed)
Waited 20 minutes for receiving RN to arrive to room to pt's room. This RN had called receiving RN twice from PACU to give report, but receiving RN was unavailable. This RN then had to call to tell receiving department & RN to update that patient is ordered to be started on Magnesium drip, has severe range blood pressures, & has to come to unit now, & has been in PACU for sufficient time. Receiving RN informed this RN was awaiting a helmet to arrive to wear when caring for COVID patients. Reminded her that patient had severe range blood pressures & needs Magnesium started now. She has Magnesium, this RN brought tubing.

## 2020-02-01 NOTE — Anesthesia Postprocedure Evaluation (Signed)
Anesthesia Post Note  Patient: Christie Fowler  Procedure(s) Performed: CESAREAN SECTION (N/A )     Patient location during evaluation: PACU Anesthesia Type: General Level of consciousness: awake and alert Pain management: pain level controlled Vital Signs Assessment: post-procedure vital signs reviewed and stable Respiratory status: spontaneous breathing and respiratory function stable Cardiovascular status: stable Postop Assessment: no apparent nausea or vomiting Anesthetic complications: no Comments: BP elevated. Patient asymptomatic. IV antihypertensives have been given. Advised that the [facility] team will need to treat her with po anti-hypertensives post-operatively for better control. Tanna Furry, MD     No complications documented.  Last Vitals:  Vitals:   02/01/20 1205 02/01/20 1210  BP:    Pulse: (!) 56 (!) 57  Resp: 13 14  Temp:    SpO2: 100% 100%    Last Pain:  Vitals:   02/01/20 1115  TempSrc:   PainSc: 0-No pain   Pain Goal: Patients Stated Pain Goal: 9 (02/01/20 0815)                 Mellody Dance

## 2020-02-01 NOTE — Progress Notes (Signed)
CRITICAL VALUE ALERT  Critical Value:  Magnesium 6.6  Date & Time Notied:  02/01/2020 1920  Provider Notified: Dr. Jackelyn Knife  Orders Received/Actions taken: Left voicemail.

## 2020-02-01 NOTE — Progress Notes (Signed)
Pt c/o chest tightness. BP 110/81 and O2 98% on room air. Notified Dr. Jackelyn Knife. Received verbal order for stat magnesium level and an order for mylanta. Notified phlebotomy of stat mag order. Will continue to monitor.

## 2020-02-01 NOTE — Progress Notes (Signed)
Spoke with pt at door She is feeling ok, trying to eat something.  She had some chest tightness earlier, this has resolved Afeb, was actually hypothermic at 93.3, now 97.6, VSS, BP 110-140/80-90 Mg level 6.6  Seems to be stable, will continue magnesium for 24 hrs, monitor closely

## 2020-02-01 NOTE — MAU Note (Signed)
...  Christie Fowler is a 19 y.o. at [redacted]w[redacted]d here in MAU reporting: her water broke at 0315 and CTX began immediately after. Clear fluids, no odor. +FM. No VB.   Pain score: 7/10 lower abdomen and lower back

## 2020-02-01 NOTE — Op Note (Signed)
Preoperative diagnosis: Intrauterine pregnancy at 37 weeks, fetal bradycardia, gestational thrombocytopenia, COVID positive Postoperative diagnosis: Same Procedure: Primary low transverse cesarean section without extensions Surgeon: Lavina Hamman M.D. Anesthesia: General Findings: Patient had normal gravid anatomy and delivered a viable female infant with Apgars, weight, and arterial cord pH pending Estimated blood loss: 200 cc Specimens: Placenta sent to labor and delivery Complications: None  Procedure in detail: The patient was taken to the operating room rapidly and placed in the dorsosupine position. General anesthesia was induced once she had a quick betadine prep, foley catheter inserted, and was draped.  Abdomen was rapidly entered via a standard Pfannenstiel incision. Once the peritoneal cavity was entered the Alexis disposable self-retaining retractor was placed and good visualization was achieved. A 4 cm transverse incision was then made in the lower uterine segment pushing the bladder inferior. Once the uterine cavity was entered the incision was extended digitally, clear amniotic fluid. The fetal vertex was grasped and delivered through the incision atraumatically. Mouth and nares were suctioned. The remainder of the infant then delivered atraumatically. Cord was doubly clamped and cut and the infant handed to the awaiting pediatric team. Cord blood and arterial cord pH were obtained. The placenta delivered spontaneously. Uterus was wiped dry with clean lap pad and all clots and debris were removed. Uterine incision was inspected and found to be free of extensions. Uterine incision was closed in 1 layer with running locking #1 Chromic. Tubes and ovaries were inspected and found to be normal. Uterine incision was inspected and found to be hemostatic. Bleeding from serosal edges was controlled with electrocautery. The Alexis retractor was removed. Subfascial space was irrigated and made  hemostatic with electrocautery. Peritoneum was closed with 2-0 Vicryl.  Fascia was closed in running fashion starting at both ends and meeting in the middle with 0 Vicryl. Subcutaneous tissue was then irrigated and made hemostatic with electrocautery. Skin was closed with staples followed by a sterile dressing. Patient tolerated the procedure well and was extubated and taken to recovery in stable condition. Counts were correct x2, she received Ancef 2 g IV at the beginning of the procedure and she had PAS hose on throughout the procedure.

## 2020-02-01 NOTE — Progress Notes (Signed)
Bear hugger placed on pt

## 2020-02-01 NOTE — H&P (Signed)
Christie Fowler is a 19 y.o. female, G1P0, EGA [redacted] weeks with EDC 1=17 presenting for leaking fluid.  On eval in MAU, ROM confirmed with + fern, 2 cm dilated.  Prenatal course complicated by gestational thrombocytopenia with platelets running 80-90k, kidney stones, IUGR with AC 4%ile with normal BPP and UA dopplers.  BP also elevated on admission, received 2 doses of IV Labetalol and BP then 130/90, PIH labs normal except for UPC 7+.  FHT had some intermittent variable and prolonged decels, when I saw her this morning I inserted IUPC and FSE.  FSE never performed well, but after that exam we were only briefly able to get FHT above 90s.    OB History    Gravida  1   Para      Term      Preterm      AB      Living        SAB      IAB      Ectopic      Multiple      Live Births             Past Medical History:  Diagnosis Date  . Medical history non-contributory    Past Surgical History:  Procedure Laterality Date  . NO PAST SURGERIES     Family History: family history includes Diabetes in her paternal grandmother; Hypertension in her father. Social History:  reports that she has never smoked. She has never used smokeless tobacco. She reports that she does not drink alcohol and does not use drugs.     Maternal Diabetes: No Genetic Screening: Normal Maternal Ultrasounds/Referrals: IUGR Fetal Ultrasounds or other Referrals:  None Maternal Substance Abuse:  No Significant Maternal Medications:  None Significant Maternal Lab Results:  Group B Strep positive Other Comments:  None  Review of Systems  Respiratory: Negative.   Cardiovascular: Negative.    Maternal Medical History:  Reason for admission: Rupture of membranes and contractions.   Contractions: Perceived severity is moderate.    Fetal activity: Perceived fetal activity is normal.    Prenatal Complications - Diabetes: none.    Dilation: 3.5 Effacement (%): 90 Station: 0 Exam by:: Enis Slipper,  RN Blood pressure (!) 137/94, pulse 65, temperature 97.7 F (36.5 C), temperature source Oral, resp. rate 20, height 5\' 3"  (1.6 m), weight 59.6 kg, last menstrual period 11/11/2018. Maternal Exam:  Uterine Assessment: Contraction strength is moderate.  Contraction frequency is irregular.   Abdomen: Patient reports no abdominal tenderness. Estimated fetal weight is 6 lbs.   Fetal presentation: vertex  Introitus: Normal vulva. Normal vagina.  Ferning test: positive.  Amniotic fluid character: clear.  Pelvis: adequate for delivery.      Physical Exam Vitals reviewed.  Constitutional:      Appearance: Normal appearance.  Cardiovascular:     Rate and Rhythm: Normal rate and regular rhythm.  Pulmonary:     Effort: Pulmonary effort is normal. No respiratory distress.  Abdominal:     Palpations: Abdomen is soft.  Genitourinary:    General: Normal vulva.  Neurological:     Mental Status: She is alert.     Prenatal labs: ABO, Rh: --/--/A POS (12/27 09-26-1987) Antibody: NEG (12/27 0456) Rubella:  immune RPR:   NR HBsAg:  neg  HIV:   NR GBS:   pos  Assessment/Plan: IUP at 37 weeks with SROM admitted in latent labor.  Also with IUGR with reassuring antenatal testing, +GBS, +COVID test on admission-asymptomatic.  BP elevated, no PIH symptoms, did have some severe range BP that required treatment but most recent BP prior to delivery 130/90.  PIH labs normal except for UPC 7+-I am suspicious this is inaccurate. She had fetal bradycardia after internal monitors placed, taken for stat c-section, see op note.  Will monitor BP closely, start magnesium for seizure prophylaxis if requires more IV medication to treat BP, COVID protocol  Leighton Roach Erna Brossard 02/01/2020, 10:31 AM

## 2020-02-01 NOTE — Transfer of Care (Signed)
Immediate Anesthesia Transfer of Care Note  Patient: Christie Fowler  Procedure(s) Performed: CESAREAN SECTION (N/A )  Patient Location: PACU  Anesthesia Type:General  Level of Consciousness: sedated  Airway & Oxygen Therapy: Patient Spontanous Breathing and Patient connected to nasal cannula oxygen  Post-op Assessment: Post -op Vital signs reviewed and stable  Post vital signs: Reviewed and stable  Last Vitals:  Vitals Value Taken Time  BP    Temp    Pulse    Resp    SpO2      Last Pain:  Vitals:   02/01/20 0815  TempSrc: Oral  PainSc: 7       Patients Stated Pain Goal: 9 (02/01/20 0815)  Complications: No complications documented.

## 2020-02-01 NOTE — Lactation Note (Signed)
This note was copied from a baby's chart. Lactation Consultation Note  Patient Name: Christie Fowler Today's Date: 02/01/2020 Reason for consult: Initial assessment;1st time breastfeeding;NICU baby Age:21 hours  Initial visit to P1 covid19+ mother of 12 hours old infant in NICU. Mother expressed interest in pumping to feed her baby. DEBP brought to room and explained pump basics/cleaning/frequency as well as assisted mother with pumping. Talked about milk storage. Colostrum collected on flange. Talked about breast massage and hand expression. Colostrum easily expressed.   Reviewed Breastfeeding a NICU baby booklet and lactation services information with mother and encouraged to contact [facility] for support and questions.  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     Maternal Data Formula Feeding for Exclusion: Yes Reason for exclusion: Admission to Intensive Care Unit (ICU) post-partum Has patient been taught Hand Expression?: Yes Does the patient have breastfeeding experience prior to this delivery?: No  Feeding Feeding Type: Formula  Interventions Interventions: DEBP;Hand pump;Expressed milk;Hand express;Breast massage  Lactation Tools Discussed/Used WIC Program: Yes Pump Education: Setup, frequency, and cleaning;Milk Storage Initiated by:: Christie Fowler IBCLC Date initiated:: 02/01/20   Consult Status Consult Status: Follow-up Date: 02/02/20 Follow-up type: In-patient    Temekia Caskey A Higuera Ancidey 02/01/2020, 9:57 PM

## 2020-02-02 LAB — CBC
HCT: 33.5 % — ABNORMAL LOW (ref 36.0–46.0)
Hemoglobin: 11.3 g/dL — ABNORMAL LOW (ref 12.0–15.0)
MCH: 30.8 pg (ref 26.0–34.0)
MCHC: 33.7 g/dL (ref 30.0–36.0)
MCV: 91.3 fL (ref 80.0–100.0)
Platelets: 78 10*3/uL — ABNORMAL LOW (ref 150–400)
RBC: 3.67 MIL/uL — ABNORMAL LOW (ref 3.87–5.11)
RDW: 14 % (ref 11.5–15.5)
WBC: 13.1 10*3/uL — ABNORMAL HIGH (ref 4.0–10.5)
nRBC: 0 % (ref 0.0–0.2)

## 2020-02-02 MED ORDER — NIFEDIPINE ER OSMOTIC RELEASE 30 MG PO TB24
30.0000 mg | ORAL_TABLET | Freq: Every day | ORAL | Status: DC
  Administered 2020-02-02: 14:00:00 30 mg via ORAL
  Filled 2020-02-02: qty 1

## 2020-02-02 MED ORDER — ENOXAPARIN SODIUM 40 MG/0.4ML ~~LOC~~ SOLN
40.0000 mg | SUBCUTANEOUS | Status: DC
  Administered 2020-02-02 – 2020-02-03 (×2): 40 mg via SUBCUTANEOUS
  Filled 2020-02-02 (×2): qty 0.4

## 2020-02-02 MED ORDER — NIFEDIPINE ER OSMOTIC RELEASE 30 MG PO TB24
90.0000 mg | ORAL_TABLET | Freq: Every day | ORAL | Status: DC
  Administered 2020-02-03 – 2020-02-04 (×2): 90 mg via ORAL
  Filled 2020-02-02 (×2): qty 3

## 2020-02-02 MED ORDER — NIFEDIPINE ER OSMOTIC RELEASE 30 MG PO TB24
30.0000 mg | ORAL_TABLET | Freq: Once | ORAL | Status: AC
  Administered 2020-02-02: 17:00:00 30 mg via ORAL
  Filled 2020-02-02: qty 1

## 2020-02-02 MED ORDER — NIFEDIPINE ER OSMOTIC RELEASE 30 MG PO TB24: 60.0000 mg | ORAL_TABLET | Freq: Every day | ORAL | Status: DC

## 2020-02-02 NOTE — Lactation Note (Signed)
This note was copied from a baby's chart. Lactation Consultation Note  Patient Name: Christie Fowler Date: 02/02/2020 Reason for consult: Follow-up assessment;NICU baby;Primapara;1st time breastfeeding;Early term 37-38.6wks;Infant < 6lbs Age:21 hours  LC spoke with Mom's RN.  Mom has been pumping regularly without any difficulty.  RN knows to call LC prn for assistance. Previous LC faxed WIC referral.   Interventions Interventions: DEBP  Lactation Tools Discussed/Used Tools: Pump Breast pump type: Double-Electric Breast Pump   Consult Status Consult Status: Follow-up Date: 02/03/20 Follow-up type: In-patient    Christie Fowler 02/02/2020, 10:41 AM

## 2020-02-02 NOTE — Progress Notes (Signed)
Patient screened out for psychosocial assessment since none of the following apply: °Psychosocial stressors documented in mother or baby's chart °Gestation less than 32 weeks °Code at delivery  °Infant with anomalies °Please contact the Clinical Social Worker if specific needs arise, by MOB's request, or if MOB scores greater than 9/yes to question 10 on Edinburgh Postpartum Depression Screen. ° °Benedicto Capozzi Folmer-Gilyard, MSW, LCSW °Clinical Social Work °[phone] °  °

## 2020-02-02 NOTE — Plan of Care (Signed)
  Problem: Nutrition: Goal: Adequate nutrition will be maintained Outcome: Completed/Met   Problem: Coping: Goal: Level of anxiety will decrease Outcome: Completed/Met   Problem: Elimination: Goal: Will not experience complications related to urinary retention Outcome: Completed/Met   Problem: Activity: Goal: Will verbalize the importance of balancing activity with adequate rest periods Outcome: Completed/Met   Problem: Coping: Goal: Ability to identify and utilize available resources and services will improve Outcome: Completed/Met   Problem: Education: Goal: Knowledge of disease or condition will improve Outcome: Completed/Met   Problem: Education: Goal: Knowledge of risk factors and measures for prevention of condition will improve Outcome: Completed/Met   Problem: Coping: Goal: Psychosocial and spiritual needs will be supported Outcome: Completed/Met   Problem: Respiratory: Goal: Will maintain a patent airway Outcome: Completed/Met Goal: Complications related to the disease process, condition or treatment will be avoided or minimized Outcome: Completed/Met

## 2020-02-02 NOTE — Progress Notes (Signed)
Patient ID: Christie Fowler, female   DOB: [birth year], 19 y.o.   MRN: [id number] Chart update.  Pt feeling better off magnesium with no further chest tightness and minimal SOB.  Pharmacy recommends monoclonal antibodies and this was offered to the patient.  She is reviewing information and will decide.   BP still elevated despite adding another 30mg  or procardia so will increase AM dose to 90mg .

## 2020-02-02 NOTE — Progress Notes (Signed)
Subjective: Postpartum Day 1: STAT Cesarean Delivery/preeclampsia/COVID +/Gestational thrombocytopenia  Patient reports tolerating PO and no problems voiding.  She has ambulated and reports occasional mild SOB, but nothing persistent.  Feels sluggish on magnesium.    Objective: Vital signs in last 24 hours: Temp:  [93.3 F (34.1 C)-98.3 F (36.8 C)] 98.2 F (36.8 C) (12/28 0827) Pulse Rate:  [52-92] 78 (12/28 0827) Resp:  [10-23] 18 (12/28 0827) BP: (110-165)/(77-119) 135/94 (12/28 0827) SpO2:  [96 %-100 %] 100 % (12/28 0827)  Physical Exam:  General: alert and cooperative Lochia: appropriate Uterine Fundus: firm Incision: C/D/I DVT Evaluation: No evidence of DVT seen on physical exam.  Recent Labs    02/01/20 0456 02/02/20 0826  HGB 12.5 11.3*  HCT 37.5 33.5*    Assessment/Plan: Status post Cesarean section. Postoperative course complicated by preeclampsia. Platelets stable at 78K and good urine diuresis. Will d/c magnesium as has been 24 hours from delivery. No severe range BP but some elevations to 90's so will start procardia XL and titrate as needed. Add lovenox for c-section + COVID. Pt instructed to notify RN if SOB persists.     Oliver Pila 02/02/2020, 10:14 AM

## 2020-02-03 ENCOUNTER — Encounter (HOSPITAL_COMMUNITY): Payer: Self-pay | Admitting: Obstetrics and Gynecology

## 2020-02-03 LAB — CBC
HCT: 34 % — ABNORMAL LOW (ref 36.0–46.0)
Hemoglobin: 11.2 g/dL — ABNORMAL LOW (ref 12.0–15.0)
MCH: 30.8 pg (ref 26.0–34.0)
MCHC: 32.9 g/dL (ref 30.0–36.0)
MCV: 93.4 fL (ref 80.0–100.0)
Platelets: 64 10*3/uL — ABNORMAL LOW (ref 150–400)
RBC: 3.64 MIL/uL — ABNORMAL LOW (ref 3.87–5.11)
RDW: 14.8 % (ref 11.5–15.5)
WBC: 7.5 10*3/uL (ref 4.0–10.5)
nRBC: 0 % (ref 0.0–0.2)

## 2020-02-03 LAB — COMPREHENSIVE METABOLIC PANEL
ALT: 30 U/L (ref 0–44)
AST: 52 U/L — ABNORMAL HIGH (ref 15–41)
Albumin: 1.7 g/dL — ABNORMAL LOW (ref 3.5–5.0)
Alkaline Phosphatase: 159 U/L — ABNORMAL HIGH (ref 38–126)
Anion gap: 6 (ref 5–15)
BUN: 5 mg/dL — ABNORMAL LOW (ref 6–20)
CO2: 23 mmol/L (ref 22–32)
Calcium: 7.6 mg/dL — ABNORMAL LOW (ref 8.9–10.3)
Chloride: 106 mmol/L (ref 98–111)
Creatinine, Ser: 0.69 mg/dL (ref 0.44–1.00)
GFR, Estimated: >60 mL/min (ref >60)
Glucose, Bld: 73 mg/dL (ref 70–99)
Potassium: 4.9 mmol/L (ref 3.5–5.1)
Sodium: 135 mmol/L (ref 135–145)
Total Bilirubin: 0.4 mg/dL (ref 0.3–1.2)
Total Protein: 5 g/dL — ABNORMAL LOW (ref 6.5–8.1)

## 2020-02-03 LAB — SURGICAL PATHOLOGY

## 2020-02-03 NOTE — Progress Notes (Signed)
Patient ID: Christie Fowler, female   DOB: January 13, 2001, 19 y.o.   MRN: [id number] Pt reports pain well controlled, lochia mild, appetite good, voiding well. She denies HA, SOB, CP, visual changes. She expresses frustration at not being able to visit her baby in NICU given covid status. She is ambulating around the room with no difficulty. VSS: 120s/80s, 17, sats 98% RA GEN - NAD ABD - ff, dressing with one small old blood spot EXT - no homans, no edema   7.5>11.2<64, Blood glucose 73,  AST 52, ALT 30  A/P: POD#2 s/p STAT Cesarean Delivery - recovering well, pain controlled        Preeclampsia - BP stable on 90mg  procardia xl         COVID + - pt asymptomatic. Discussed restrictions and precautions.             Discussed pt care with charge nurse, she contacted Neonatologist to request they update pt on how her baby is doing. Reiterated to pt and FOB hospital precautions           Gestational thrombocytopenia - platelets now 64K; per consult with pharmacy will discontinue ibuprofen and lovenox. Recheck cbc tomorrow am            Likely discharge to home tomorrow

## 2020-02-03 NOTE — Progress Notes (Signed)
Informed patient that since her significant other left the hospital, he is not allowed to come back.  Patient verbalized understanding and stated "he will not be back".

## 2020-02-04 LAB — CBC
HCT: 32.3 % — ABNORMAL LOW (ref 36.0–46.0)
Hemoglobin: 10.7 g/dL — ABNORMAL LOW (ref 12.0–15.0)
MCH: 30.7 pg (ref 26.0–34.0)
MCHC: 33.1 g/dL (ref 30.0–36.0)
MCV: 92.8 fL (ref 80.0–100.0)
Platelets: 56 10*3/uL — ABNORMAL LOW (ref 150–400)
RBC: 3.48 MIL/uL — ABNORMAL LOW (ref 3.87–5.11)
RDW: 14.8 % (ref 11.5–15.5)
WBC: 6.9 10*3/uL (ref 4.0–10.5)
nRBC: 0 % (ref 0.0–0.2)

## 2020-02-04 MED ORDER — ACETAMINOPHEN 500 MG PO TABS: 1000.0000 mg | ORAL_TABLET | Freq: Three times a day (TID) | ORAL | 1 refills | Status: DC

## 2020-02-04 MED ORDER — SIMETHICONE 80 MG PO CHEW: 80.0000 mg | CHEWABLE_TABLET | ORAL | 0 refills | Status: DC | PRN

## 2020-02-04 MED ORDER — NIFEDIPINE ER OSMOTIC RELEASE 90 MG PO TB24: 90.0000 mg | ORAL_TABLET | Freq: Every day | ORAL | 1 refills | Status: DC

## 2020-02-04 MED ORDER — OXYCODONE HCL 5 MG PO TABS: 5.0000 mg | ORAL_TABLET | Freq: Four times a day (QID) | ORAL | 0 refills | Status: DC | PRN

## 2020-02-04 NOTE — Lactation Note (Signed)
This note was copied from a baby's chart. Lactation Consultation Note  Patient Name: Christie Fowler JOINO'M Date: 02/04/2020 Reason for consult: Follow-up assessment;1st time breastfeeding;NICU baby;Early term 37-38.6wks Age:21 hours   0745 - 0815 - I followed up with [name]. She states that she's pumping every three hours and her milk is beginning to transition. She states that she is filling up her colostrum containers. I praised her and indicated that as she is pumping 20+ mls/milk (combined) she should begin to switch to the maintain setting on her DEBP.  I reviewed milk storage guidelines and did a supply check in the room. I found her labels and additional storage containers.  I recommended that she pump 8+ times a day.   She states that she needs a WIC pump. I stated that I will call WIC to follow up about a loaner DEBP. I would also send a cooler bag/ice pack and the [facility] hotline number to her room via her RN.  I recommended that she follow up with them today to schedule a pickup.  I reviewed our community breast feeding resources. All questions answered at this time.  Post visit, I followed up with her OB RN and the NICU RN.  Maternal Data Does the patient have breastfeeding experience prior to this delivery?: No  Feeding Feeding Type: Formula  LATCH Score                   Interventions Interventions: Breast feeding basics reviewed;DEBP  Lactation Tools Discussed/Used Tools: Other (comment) (storage bottles; cooler) Breast pump type: Double-Electric Breast Pump WIC Program: Yes Pump Education: Setup, frequency, and cleaning;Milk Storage   Consult Status Consult Status: Follow-up Date: 02/06/20 (I completed discharge education; she may be discharged tomorrow; baby will remain in NICUE=) Follow-up type: In-patient    Walker Shadow 02/04/2020, 8:41 AM

## 2020-02-04 NOTE — Progress Notes (Signed)
Patient ID: Christie Fowler, female   DOB: 11/08/2000, 19 y.o.   MRN: [id number] Pt reports pain well controlled, lochia mild, appetite good, voiding well. She denies HA, SOB, CP, visual changes. She is ambulating around the room with no difficulty. VSS: 120s/80s, 17, sats 100% RA GEN - NAD ABD - ff, dressing with one small old blood spot, unchanged EXT - no homans, no edema   7.5>11.2<64 -->6.9>10.7<57 (no significant drop)  A/P: POD#3 s/p STAT Cesarean Delivery - recovering well, pain controlled        Preeclampsia - BP stable on 90mg  procardia xl         COVID + - pt asymptomatic. Reviewed current CDC precautions and instructions for masking           Thrombocytopenia - platelets now 57K, holding ibuprofen. Would check CBC postpartum, pt knows to not take NSAIDS. Would likely benefit from heme/onc consult postpartum given history of thrombocytopenia (plt 129 76yr ago)           Likely discharge to home tomorrow

## 2020-02-04 NOTE — Plan of Care (Signed)
  Problem: Education: Goal: Knowledge of Childbirth will improve Outcome: Adequate for Discharge Goal: Ability to make informed decisions regarding treatment and plan of care will improve Outcome: Adequate for Discharge Goal: Ability to state and carry out methods to decrease the pain will improve Outcome: Adequate for Discharge Goal: Individualized Educational Video(s) Outcome: Adequate for Discharge   Problem: Coping: Goal: Ability to verbalize concerns and feelings about labor and delivery will improve Outcome: Adequate for Discharge   Problem: Life Cycle: Goal: Ability to make normal progression through stages of labor will improve Outcome: Adequate for Discharge Goal: Ability to effectively push during vaginal delivery will improve Outcome: Adequate for Discharge   Problem: Role Relationship: Goal: Will demonstrate positive interactions with the child Outcome: Adequate for Discharge   Problem: Safety: Goal: Risk of complications during labor and delivery will decrease Outcome: Adequate for Discharge   Problem: Pain Management: Goal: Relief or control of pain from uterine contractions will improve Outcome: Adequate for Discharge   Problem: Education: Goal: Knowledge of General Education information will improve Description: Including pain rating scale, medication(s)/side effects and non-pharmacologic comfort measures Outcome: Adequate for Discharge   Problem: Health Behavior/Discharge Planning: Goal: Ability to manage health-related needs will improve Outcome: Adequate for Discharge   Problem: Clinical Measurements: Goal: Ability to maintain clinical measurements within normal limits will improve Outcome: Adequate for Discharge Goal: Will remain free from infection Outcome: Adequate for Discharge Goal: Diagnostic test results will improve Outcome: Adequate for Discharge Goal: Respiratory complications will improve Outcome: Adequate for Discharge Goal:  Cardiovascular complication will be avoided Outcome: Adequate for Discharge   Problem: Activity: Goal: Risk for activity intolerance will decrease Outcome: Adequate for Discharge   Problem: Elimination: Goal: Will not experience complications related to bowel motility Outcome: Adequate for Discharge   Problem: Pain Managment: Goal: General experience of comfort will improve Outcome: Adequate for Discharge   Problem: Safety: Goal: Ability to remain free from injury will improve Outcome: Adequate for Discharge   Problem: Skin Integrity: Goal: Risk for impaired skin integrity will decrease Outcome: Adequate for Discharge   Problem: Education: Goal: Knowledge of condition will improve Outcome: Adequate for Discharge Goal: Individualized Educational Video(s) Outcome: Adequate for Discharge Goal: Individualized Newborn Educational Video(s) Outcome: Adequate for Discharge   Problem: Activity: Goal: Ability to tolerate increased activity will improve Outcome: Adequate for Discharge   Problem: Life Cycle: Goal: Chance of risk for complications during the postpartum period will decrease Outcome: Adequate for Discharge   Problem: Role Relationship: Goal: Ability to demonstrate positive interaction with newborn will improve Outcome: Adequate for Discharge   Problem: Skin Integrity: Goal: Demonstration of wound healing without infection will improve Outcome: Adequate for Discharge   Problem: Education: Goal: Knowledge of the prescribed therapeutic regimen will improve Outcome: Adequate for Discharge   Problem: Fluid Volume: Goal: Peripheral tissue perfusion will improve Outcome: Adequate for Discharge   Problem: Clinical Measurements: Goal: Complications related to disease process, condition or treatment will be avoided or minimized Outcome: Adequate for Discharge

## 2020-02-04 NOTE — Discharge Instructions (Addendum)
Cesarean Delivery, Care After This sheet gives you information about how to care for yourself after your procedure. Your health care provider may also give you more specific instructions. If you have problems or questions, contact your health care provider. What can I expect after the procedure? After the procedure, it is common to have:  A small amount of blood or clear fluid coming from the incision.  Some redness, swelling, and pain in your incision area.  Some abdominal pain and soreness.  Vaginal bleeding (lochia). Even though you did not have a vaginal delivery, you will still have vaginal bleeding and discharge.  Pelvic cramps.  Fatigue. You may have pain, swelling, and discomfort in the tissue between your vagina and your anus (perineum) if:  Your C-section was unplanned, and you were allowed to labor and push.   An incision was made in the area (episiotomy) or the tissue tore during attempted vaginal delivery. Follow these instructions at home: Incision care   Follow instructions from your health care provider about how to take care of your incision. Make sure you: ? Wash your hands with soap and water before you change your bandage (dressing). If soap and water are not available, use hand sanitizer. ? If you have a dressing, change it or remove it as told by your health care provider. ? Leave stitches (sutures), skin staples, skin glue, or adhesive strips in place. These skin closures may need to stay in place for 2 weeks or longer. If adhesive strip edges start to loosen and curl up, you may trim the loose edges. Do not remove adhesive strips completely unless your health care provider tells you to do that.  Check your incision area every day for signs of infection. Check for: ? More redness, swelling, or pain. ? More fluid or blood. ? Warmth. ? Pus or a bad smell.  Do not take baths, swim, or use a hot tub until your health care provider says it's okay. Ask your  health care provider if you can take showers.  When you cough or sneeze, hug a pillow. This helps with pain and decreases the chance of your incision opening up (dehiscing). Do this until your incision heals. Medicines  Take over-the-counter and prescription medicines only as told by your health care provider.  If you were prescribed an antibiotic medicine, take it as told by your health care provider. Do not stop taking the antibiotic even if you start to feel better.  Do not drive or use heavy machinery while taking prescription pain medicine. Lifestyle  Do not drink alcohol. This is especially important if you are breastfeeding or taking pain medicine.  Do not use any products that contain nicotine or tobacco, such as cigarettes, e-cigarettes, and chewing tobacco. If you need help quitting, ask your health care provider. Eating and drinking  Drink at least 8 eight-ounce glasses of water every day unless told not to by your health care provider. If you breastfeed, you may need to drink even more water.  Eat high-fiber foods every day. These foods may help prevent or relieve constipation. High-fiber foods include: ? Whole grain cereals and breads. ? Brown rice. ? Beans. ? Fresh fruits and vegetables. Activity   If possible, have someone help you care for your baby and help with household activities for at least a few days after you leave the hospital.  Return to your normal activities as told by your health care provider. Ask your health care provider what activities are safe  for you.  Rest as much as possible. Try to rest or take a nap while your baby is sleeping.  Do not lift anything that is heavier than 10 lbs (4.5 kg), or the limit that you were told, until your health care provider says that it is safe.  Talk with your health care provider about when you can engage in sexual activity. This may depend on your: ? Risk of infection. ? How fast you heal. ? Comfort and desire  to engage in sexual activity. General instructions  Do not use tampons or douches until your health care provider approves.  Wear loose, comfortable clothing and a supportive and well-fitting bra.  Keep your perineum clean and dry. Wipe from front to back when you use the toilet.  If you pass a blood clot, save it and call your health care provider to discuss. Do not flush blood clots down the toilet before you get instructions from your health care provider.  Keep all follow-up visits for you and your baby as told by your health care provider. This is important. Contact a health care provider if:  You have: ? A fever. ? Bad-smelling vaginal discharge. ? Pus or a bad smell coming from your incision. ? Difficulty or pain when urinating. ? A sudden increase or decrease in the frequency of your bowel movements. ? More redness, swelling, or pain around your incision. ? More fluid or blood coming from your incision. ? A rash. ? Nausea. ? Little or no interest in activities you used to enjoy. ? Questions about caring for yourself or your baby.  Your incision feels warm to the touch.  Your breasts turn red or become painful or hard.  You feel unusually sad or worried.  You vomit.  You pass a blood clot from your vagina.  You urinate more than usual.  You are dizzy or light-headed. Get help right away if:  You have: ? Pain that does not go away or get better with medicine. ? Chest pain. ? Difficulty breathing. ? Blurred vision or spots in your vision. ? Thoughts about hurting yourself or your baby. ? New pain in your abdomen or in one of your legs. ? A severe headache.  You faint.  You bleed from your vagina so much that you fill more than one sanitary pad in one hour. Bleeding should not be heavier than your heaviest period. Summary  After the procedure, it is common to have pain at your incision site, abdominal cramping, and slight bleeding from your vagina.  Check  your incision area every day for signs of infection.  Tell your health care provider about any unusual symptoms.  Keep all follow-up visits for you and your baby as told by your health care provider. This information is not intended to replace advice given to you by your health care provider. Make sure you discuss any questions you have with your health care provider. Document Revised: 07/31/2017 Document Reviewed: 07/31/2017 Elsevier Patient Education  2020 [employer].   Breastfeeding and Self-Care Breastfeeding can be challenging, especially during the first few weeks after childbirth. It is normal to have some problems when you start to breastfeed your new baby, even if you have breastfed before. There are things that you can do to take care of yourself and help prevent common breastfeeding problems. Work with your health care provider or breastfeeding specialist (Advertising copywriter) to find strategies that work best for you. How does this affect me? Keeping your breasts healthy  and ensuring that your baby attaches to your nipple well (a good latch) are important parts of having a good breastfeeding experience. Poor latching can lead to problems, such as:  Cracked or sore nipples.  Breasts becoming overfilled with milk (engorgement).  Plugged milk ducts.  Low milk supply.  Breast inflammation or infection. How does this affect my baby? By taking steps to avoid breastfeeding problems, you will help ensure that your baby can feed effectively and gain weight as he or she should. Follow these instructions at home: Breastfeeding strategy   Always make sure that your baby latches and is in a proper position. Try different breastfeeding positions to find one that works best for you and your baby.  Breastfeed when you feel the need to reduce the fullness of your breasts or when your baby shows signs of hunger. This is called "breastfeeding on demand."  Do not delay feedings.  Try  to relax when it is time to feed your baby. This helps to trigger your let-down reflex, which releases milk from your breast.  To help increase milk flow: ? Pump or hand express a small amount of breast milk right before breastfeeding to soften your breast, areola, and nipple. ? Apply warm, moist heat to your breast right before feeding to increase circulation and help milk flow. You can do this in the shower or with hand towels soaked with warm water. ? Massage your breast right before or during feeding to increase circulation and help milk flow. Breast care   Ensure that your breasts stay moisturized and healthy. This will help prevent cracking and ease soreness. To do this: ? Avoid using soap on your nipples. ? Let your nipples air-dry for 3-4 minutes after each feeding. ? Use only cotton bra pads to absorb breast milk that leaks. Be sure to change the pads if they become soaked with milk. If you use disposable bra pads, change them often. ? Use lanolin on your nipples after nursing. If you use pure lanolin, you do not need to wash it off before feeding your baby again. Pure lanolin is not poisonous (toxic) to your baby. ? Massage some breast milk into your nipples:  Use your hand to squeeze out a few drops of breast milk (hand express).  Gently massage the milk into your nipples.  Let your nipples air-dry.  Wear a supportive nursing bra. Avoid wearing tight clothing, bras that put pressure on your breasts, or underwire bras.  Use cold therapy to help relieve pain or swelling of your breasts: ? Put ice in a plastic bag. ? Place a towel between your skin and the bag. ? Leave the ice on for 20 minutes, 2-3 times a day. General instructions  Drink enough fluid to keep your urine pale yellow.  Get plenty of rest. Sleep when your baby sleeps.  Talk to your health care provider or lactation consultant before taking any herbal supplements. Contact a health care provider if:  You have  nipple pain.  You have cracking or soreness in your nipples that lasts longer than 1 week.  You have breast engorgement that lasts longer than 48 hours.  You have a fever.  You have pus-like discharge coming from your nipple.  You have redness, a rash, swelling, itching, or burning on your breast.  Your baby does not gain weight or loses weight. Summary  Keeping your breasts healthy and ensuring a good latch are important parts of having a good breastfeeding experience. Take steps to  take care of yourself and work with your health care provider or breastfeeding specialist (Advertising copywriter) to find strategies that work best for you.  Always make sure that your baby is latched and positioned properly. Try different breastfeeding positions to find one that works best for you and your baby.  Keep your nipples moisturized, drink plenty of fluid, and get plenty of rest. Feed on demand, and do not delay feedings. This information is not intended to replace advice given to you by your health care provider. Make sure you discuss any questions you have with your health care provider. Document Revised: 05/14/2018 Document Reviewed: 08/29/2016 Elsevier Patient Education  2020 [employer].

## 2020-02-04 NOTE — Discharge Summary (Addendum)
Postpartum Discharge Summary  Date of Service updated     Patient Name: Christie Fowler DOB: 01-Mar-2000 MRN: [id number]  Date of admission: 02/01/2020 Delivery date:02/01/2020  Delivering provider: Willis Modena, TODD  Date of discharge: 02/04/2020  Admitting diagnosis: Normal labor and delivery [O80] Intrauterine pregnancy: [redacted]w[redacted]d    Secondary diagnosis:  Active Problems:   Normal labor and delivery  Additional problems: COVID positive, PreE w/ SF    Discharge diagnosis: Term Pregnancy Delivered and Preeclampsia (severe)                                              Post partum procedures:none Complications: None  Hospital course: Patient admitted and underwent stat c-section for cat 3 tracing after presenting at 350/7 for evaluation of ROM. Uncomplicated section, please see operative notes for details. Found to be COVID positive but asymptomatic, underwent precautions. Baby in NICU. PNC c/b gestational TCP. Pt met criteria through severe range Bps and complated 24hrs MgSo4 postop.  Met postop requirements, BP WNL on Procardia 90XL. DC home with 1wk BP/incision check after CDC recommended isolation.   Magnesium Sulfate received: Yes: Seizure prophylaxis  Physical exam  Vitals:   02/04/20 0546 02/04/20 0547 02/04/20 0855 02/04/20 1331  BP:  122/76 133/81 (!) 143/96  Pulse:  82 84 97  Resp:  18 17 16   Temp:  98.3 F (36.8 C) 97.8 F (36.6 C) 98.4 F (36.9 C)  TempSrc:  Oral Oral Oral  SpO2: 100% 100% 100% 100%  Weight:      Height:       General: alert Lochia: appropriate Uterine Fundus: firm Incision: Healing well with no significant drainage, No significant erythema, Dressing is clean, dry, and intact DVT Evaluation: No evidence of DVT seen on physical exam. Negative Homan's sign. No cords or calf tenderness. Labs: Lab Results  Component Value Date   WBC 6.9 02/04/2020   HGB 10.7 (L) 02/04/2020   HCT 32.3 (L) 02/04/2020   MCV 92.8 02/04/2020   PLT 56 (L)  02/04/2020   CMP Latest Ref Rng & Units 02/03/2020  Glucose 70 - 99 mg/dL 73  BUN 6 - 20 mg/dL 5(L)  Creatinine 0.44 - 1.00 mg/dL 0.69  Sodium 135 - 145 mmol/L 135  Potassium 3.5 - 5.1 mmol/L 4.9  Chloride 98 - 111 mmol/L 106  CO2 22 - 32 mmol/L 23  Calcium 8.9 - 10.3 mg/dL 7.6(L)  Total Protein 6.5 - 8.1 g/dL 5.0(L)  Total Bilirubin 0.3 - 1.2 mg/dL 0.4  Alkaline Phos 38 - 126 U/L 159(H)  AST 15 - 41 U/L 52(H)  ALT 0 - 44 U/L 30   Edinburgh Score: Edinburgh Postnatal Depression Scale Screening Tool 02/03/2020  I have been able to laugh and see the funny side of things. 0  I have looked forward with enjoyment to things. 0  I have blamed myself unnecessarily when things went wrong. 0  I have been anxious or worried for no good reason. 0  I have felt scared or panicky for no good reason. 0  Things have been getting on top of me. 1  I have been so unhappy that I have had difficulty sleeping. 0  I have felt sad or miserable. 0  I have been so unhappy that I have been crying. 0  The thought of harming myself has occurred to me. 0  Edinburgh Postnatal Depression Scale Total 1      After visit meds:  Allergies as of 02/04/2020   No Known Allergies     Medication List    STOP taking these medications   DICLEGIS PO     TAKE these medications   acetaminophen 500 MG tablet Commonly known as: TYLENOL Take 2 tablets (1,000 mg total) by mouth every 8 (eight) hours.   ferrous sulfate 325 (65 FE) MG tablet Take 325 mg by mouth daily with breakfast.   NIFEdipine 90 MG 24 hr tablet Commonly known as: PROCARDIA XL/NIFEDICAL-XL Take 1 tablet (90 mg total) by mouth daily.   oxyCODONE 5 MG immediate release tablet Commonly known as: Oxy IR/ROXICODONE Take 1 tablet (5 mg total) by mouth every 6 (six) hours as needed for severe pain.   prenatal multivitamin Tabs tablet Take 1 tablet by mouth daily at 12 noon.   simethicone 80 MG chewable tablet Commonly known as: MYLICON Chew  1 tablet (80 mg total) by mouth as needed for flatulence.            Discharge Care Instructions  (From admission, onward)         Start     Ordered   02/04/20 0000  Leave dressing on - Keep it clean, dry, and intact until clinic visit        02/04/20 1002           Discharge home in stable condition Infant Feeding: Bottle and Breast Infant Disposition:NICU Discharge instruction: per After Visit Summary and Postpartum booklet. Activity: Advance as tolerated. Pelvic rest for 6 weeks.  Diet: routine diet Anticipated Birth Control: Unsure Postpartum Appointment:6 weeks Additional Postpartum F/U: incision/BP in 1wk Future Appointments:No future appointments. Follow up Visit:      02/04/2020 Deliah Boston, MD

## 2020-02-06 ENCOUNTER — Inpatient Hospital Stay (HOSPITAL_COMMUNITY)
Admission: AD | Admit: 2020-02-06 | Discharge: 2020-02-06 | Disposition: A | Discharge status: Home / Self Care | Payer: Medicaid Other | Attending: Obstetrics and Gynecology | Admitting: Obstetrics and Gynecology

## 2020-02-06 ENCOUNTER — Other Ambulatory Visit: Payer: Self-pay

## 2020-02-06 ENCOUNTER — Encounter (HOSPITAL_COMMUNITY): Payer: Self-pay | Admitting: Obstetrics and Gynecology

## 2020-02-06 DIAGNOSIS — U071 COVID-19: Secondary | ICD-10-CM | POA: Insufficient documentation

## 2020-02-06 DIAGNOSIS — R030 Elevated blood-pressure reading, without diagnosis of hypertension: Secondary | ICD-10-CM | POA: Insufficient documentation

## 2020-02-06 DIAGNOSIS — O9853 Other viral diseases complicating the puerperium: Secondary | ICD-10-CM | POA: Insufficient documentation

## 2020-02-06 DIAGNOSIS — O1410 Severe pre-eclampsia, unspecified trimester: Secondary | ICD-10-CM

## 2020-02-06 DIAGNOSIS — H539 Unspecified visual disturbance: Secondary | ICD-10-CM

## 2020-02-06 DIAGNOSIS — O9089 Other complications of the puerperium, not elsewhere classified: Secondary | ICD-10-CM | POA: Insufficient documentation

## 2020-02-06 DIAGNOSIS — Z419 Encounter for procedure for purposes other than remedying health state, unspecified: Secondary | ICD-10-CM | POA: Diagnosis not present

## 2020-02-06 DIAGNOSIS — O1415 Severe pre-eclampsia, complicating the puerperium: Secondary | ICD-10-CM

## 2020-02-06 HISTORY — DX: Essential (primary) hypertension: I10

## 2020-02-06 MED ORDER — NIFEDIPINE ER OSMOTIC RELEASE 30 MG PO TB24
90.0000 mg | ORAL_TABLET | Freq: Once | ORAL | Status: AC
  Administered 2020-02-06: 90 mg via ORAL
  Filled 2020-02-06: qty 3

## 2020-02-06 MED ORDER — NIFEDIPINE ER OSMOTIC RELEASE 90 MG PO TB24: 90.0000 mg | ORAL_TABLET | Freq: Every day | ORAL | 0 refills | Status: DC

## 2020-02-06 NOTE — Progress Notes (Signed)
Dr Barb Merino discussed d/c plan with pt. Written and verbal d/c instructions given and understanding voiced

## 2020-02-06 NOTE — MAU Note (Signed)
Dr Barb Merino in and removed dsg from incision and staples. Pt tol well. Incision edges well approx and incision clean and dry without redness. Pt instructed may shower but no tub baths for now. To keep incision clean and dry.

## 2020-02-06 NOTE — MAU Note (Signed)
Pt states she had c/s Monday and had really high blood pressure. Went home Weds and has not been able to get b/p med filled. Tonight had some blurry vision and felt hot and was concerned her b/p may be high. Pt Covid +

## 2020-02-06 NOTE — MAU Provider Note (Signed)
MAU Note    Chief Complaint  Patient presents with  . visual changes   HPI  Christie Fowler is a 20 year old G1P1 female on POD#5 s/p STAT primary Cesarean delivery in the setting of severe preeclampsia and +COVID (diagnosed 02/01/20), presenting with concern of elevated blood pressures and vision changes. Pt was discharged on 02/04/20 s/p 24 hours of postpartum magnesium. At time of discharge she was prescribed procardia XL 90mg  daily. However, pt reports that she has been unable to pick up her medication given "out of stock" per pharmacist. Pt reports that her vision changes are now resolved upon evaluation in the MAU. Her pain is well-controlled (rated 2/10). She has only needed to take 1 oxycodone at home. No tylenol or motrin recently. Pt has been unable to see her baby in the NICU given COVID+ status. She is actively pumping and has electronic pump at home.  No recent fever, chills, dizziness, lightheadedness, headache, chest pain, difficulty breathing, RUQ pain, dysuria, abdominal pain, incision issues, or leg swelling.  Past Medical History:  Diagnosis Date  . Hypertension     Past Surgical History:  Procedure Laterality Date  . CESAREAN SECTION N/A 02/01/2020   Procedure: CESAREAN SECTION;  Surgeon: 02/03/2020, MD;  Location: [facility] LD ORS;  Service: Obstetrics;  Laterality: N/A;  . NO PAST SURGERIES      Family History  Problem Relation Age of Onset  . Hypertension Father   . Diabetes Paternal Grandmother     Social History   Tobacco Use  . Smoking status: Never Smoker  . Smokeless tobacco: Never Used  Substance Use Topics  . Alcohol use: Never  . Drug use: Never    Allergies: No Known Allergies  Medications Prior to Admission  Medication Sig Dispense Refill Last Dose  . acetaminophen (TYLENOL) 500 MG tablet Take 2 tablets (1,000 mg total) by mouth every 8 (eight) hours. 60 tablet 1 02/05/2020 at 1000  . oxyCODONE (OXY IR/ROXICODONE) 5 MG immediate release tablet  Take 1 tablet (5 mg total) by mouth every 6 (six) hours as needed for severe pain. 30 tablet 0 02/05/2020 at 1800  . ferrous sulfate 325 (65 FE) MG tablet Take 325 mg by mouth daily with breakfast.     . Prenatal Vit-Fe Fumarate-FA (PRENATAL MULTIVITAMIN) TABS tablet Take 1 tablet by mouth daily at 12 noon.     . simethicone (MYLICON) 80 MG chewable tablet Chew 1 tablet (80 mg total) by mouth as needed for flatulence. 30 tablet 0   . [DISCONTINUED] NIFEdipine (PROCARDIA XL/NIFEDICAL-XL) 90 MG 24 hr tablet Take 1 tablet (90 mg total) by mouth daily. 60 tablet 1     Review of Systems  Constitutional: Negative for chills and fever.  HENT: Negative for sore throat.   Eyes: Positive for visual disturbance. Negative for photophobia.  Respiratory: Negative for cough, chest tightness and shortness of breath.   Cardiovascular: Negative for chest pain, palpitations and leg swelling.  Gastrointestinal: Negative for abdominal pain, constipation, diarrhea, nausea and vomiting.  Genitourinary: Negative for difficulty urinating, dysuria, vaginal bleeding, vaginal discharge and vaginal pain.  Musculoskeletal: Negative for back pain and myalgias.  Neurological: Negative for dizziness, seizures, weakness, light-headedness and headaches.   Physical Exam Blood pressure (!) 155/107, pulse 83, resp. rate 16, SpO2 100 %, unknown if currently breastfeeding. Physical Exam Constitutional:      Appearance: Normal appearance.  HENT:     Head: Normocephalic and atraumatic.     Nose: Nose normal.  Mouth/Throat:     Mouth: Mucous membranes are moist.  Eyes:     Extraocular Movements: Extraocular movements intact.  Cardiovascular:     Rate and Rhythm: Normal rate.  Pulmonary:     Effort: Pulmonary effort is normal.  Abdominal:     General: Abdomen is flat.     Palpations: Abdomen is soft.     Comments: Low transverse incision with honeycomb dressing clean/dry/intact. Staples in place.  Musculoskeletal:         General: Normal range of motion.     Cervical back: Normal range of motion and neck supple.  Skin:    General: Skin is warm and dry.     Capillary Refill: Capillary refill takes less than 2 seconds.  Neurological:     General: No focal deficit present.     Mental Status: She is alert and oriented to person, place, and time.     Cranial Nerves: No cranial nerve deficit.     Sensory: No sensory deficit.     Motor: No weakness.     Coordination: Coordination normal.     Gait: Gait normal.  Psychiatric:        Mood and Affect: Mood normal.        Behavior: Behavior normal.     MAU Course MDM 0210: Administered procardia 90mg  once.  76: Spoke with Tiffany (on-call OB pharmacist). Confirmed plan for pt to pick up medication at [facility] on [address] (confirmed open on New Year's Day and procardia 90mg  daily in stock prior to pt discharge).  63: Spoke with Dr. regarding pt and plan of care.  0300: Spoke with Dr. 80 (OBGYN on call for [facility]). Discussed plan of care. Will remove staples given barrier to presenting in-person to clinic (in quarantine given COVID positive).  0330: Staples removed. BP improved and pt asymptomatic prior to discharge.  Assessment & Plan: Christie Fowler is a 20 year old G1P1 female on POD#5 s/p STAT primary Cesarean delivery in the setting of severe preeclampsia and +COVID (diagnosed 02/01/20), presenting with concern of elevated blood pressure and vision changes. Found to have mild range blood pressures in the setting of inability to obtain procardia 90mg  daily from pharmacy (prescribed on discharge). Notable improvement in BP and resolution of blurred vision s/p administration of procardia XL 90mg  once. Staples removed from low transverse incision as noted above. - encouraged good hydration given pumping with infant in NICU - ongoing COVID quarantine per CDC guidelines as previously instructed - [facility] to [facility] on [facility] as noted above (instructed next dose in AM on 02/07/20) - provided strict return precautions for severe HA, recurrent vision changes, CP, SOB or other concerns - pt to f/u with [facility] for postpartum appt  , MD OB Fellow, [facility] 02/06/2020 3:33 AM

## 2020-02-06 NOTE — Discharge Instructions (Signed)
We administered your procardia 90mg  at 2:10AM on 02/06/20. We [facility] to [facility] on [address] It will be important to take your next dose tomorrow morning (02/07/20).  We discussed your plan of care with Dr. 04/06/20, the on-call OBGYN from your prenatal clinic. We removed your staples. Please continue to keep your incision clean, dry and intact. Please continue all other medications as previously prescribed.  Please call your OBGYN clinic or return to MAU if concern of severe headache, recurrent vision changes, difficulty breathing, chest pain or other concerns.  Please follow-up with your OBGYN as scheduled for your postpartum appointment.

## 2020-02-06 NOTE — Progress Notes (Signed)
Ivonne Andrew CNM in MAU and aware of pt's admission and B/Ps. Will order Procardia 90mg  XL po

## 2020-02-12 ENCOUNTER — Ambulatory Visit: Payer: Self-pay

## 2020-02-12 NOTE — Lactation Note (Signed)
This note was copied from a baby's chart. Lactation Consultation Note  Patient Name: Christie Fowler [id number] Date: 02/12/2020 Reason for consult: Follow-up assessment;Mother's request;NICU baby Age:22 days  LC to room at RN request. Mother present and pumping when [facility] arrived. Mother pumps q 3-4 hours with 4-6 oz yield per pumping. Mother would like to attempt to bf at the 5pm feeding. LC will plan to return to assist.   Consult Status Consult Status: Follow-up Follow-up type: In-patient    Elder Negus, MA IBCLC 02/12/2020, 4:03 PM

## 2020-02-12 NOTE — Lactation Note (Signed)
This note was copied from a baby's chart. Lactation Consultation Note  Patient Name: Christie Fowler Date: 02/12/2020 Reason for consult: NICU baby;Mother's request Age:22 days  LC to room for first breastfeeding attempt. Mom was placed in a laid back biological position with baby placed prone at mother's R breast. Baby latched quickly with rhythmic suck/swallows for 8 minutes. Afterwards, baby struggled to latch on L breast and became tachycardic. Breastfeeding was discontinued and baby was gavage fed according to the IDF algorithm. Parents were pleased with feeding and were provided with the opportunity to ask questions. All concerns were addressed. LC will plan f/u visit to further assist and support breastfeeding needs.   Consult Status Consult Status: Follow-up Follow-up type: In-patient   Elder Negus, MA IBCLC 02/12/2020, 5:27 PM

## 2020-02-15 ENCOUNTER — Ambulatory Visit: Payer: Self-pay

## 2020-02-15 NOTE — Lactation Note (Signed)
This note was copied from a baby's chart. Lactation Consultation Note  Patient Name: Christie Fowler Today's Date: 02/15/2020   Age:22 wk.o.  LC to room for 1100 feeding assistance. Mother not present. RN bottle feeding infant. Per RN, infant is now ad lib. RN is aware that LC is available to come back at a later time today prn to assist mother.    Elder Negus, MA IBCLC 02/15/2020, 11:15 AM

## 2020-02-16 ENCOUNTER — Ambulatory Visit: Payer: Self-pay

## 2020-02-16 NOTE — Lactation Note (Signed)
This note was copied from a baby's chart. Lactation Consultation Note  Patient Name: Christie Fowler HRCBU'L Date: 02/16/2020 Reason for consult: Follow-up assessment Age:22 wk.o.  Baby d/c today. Mom bf independently and pleased with progress. She is aware of outpatient LC services. Mother was provided with the opportunity to ask questions. All concerns were addressed.  Discussion was brief. No charge.    Consult Status Consult Status: Complete Follow-up type: In-patient   Elder Negus, MA IBCLC 02/16/2020, 2:21 PM

## 2020-03-03 ENCOUNTER — Other Ambulatory Visit: Payer: Self-pay

## 2020-03-03 ENCOUNTER — Encounter: Payer: Self-pay | Admitting: Family Medicine

## 2020-03-03 ENCOUNTER — Ambulatory Visit (INDEPENDENT_AMBULATORY_CARE_PROVIDER_SITE_OTHER): Payer: Medicaid Other | Admitting: Family Medicine

## 2020-03-03 VITALS — BP 110/80 | HR 88 | Ht 64.0 in | Wt 114.0 lb

## 2020-03-03 DIAGNOSIS — Z23 Encounter for immunization: Secondary | ICD-10-CM

## 2020-03-03 NOTE — Patient Instructions (Signed)
It was good to see you today.  Thank you for coming in.  Your blood pressure looks good today and you are otherwise very healthy.  We are giving you the 2nd dose of the COVID vaccine today.  You may have some arm soreness, and may also have mild symptoms of headache, fatigue or fever.  These should resolve within 1-2 days.  I recommend getting the booster in August.  Otherwise I will see you back again in a year.  Be Well, Dr Drue Flirt

## 2020-03-05 DIAGNOSIS — Z23 Encounter for immunization: Secondary | ICD-10-CM | POA: Insufficient documentation

## 2020-03-05 NOTE — Assessment & Plan Note (Addendum)
Patient had 1st dose of COVID Vaccine confirmed in August of 2021.  Patient amennable to receiving 2nd dose. - Administered 2nd dose of COVID-19 vaccine, monitored for 15 minutes afterward and counseled on possible side effects - Recommend obtaining booster in 6 months

## 2020-03-05 NOTE — Addendum Note (Signed)
Addended by: Jovita Kussmaul on: 03/05/2020 05:38 PM   Modules accepted: Level of Service

## 2020-03-05 NOTE — Progress Notes (Signed)
    SUBJECTIVE:   Chief compliant/HPI: Established care  Christie Fowler is a 20 y.o. who presents today for an annual exam and establish care.  She denies any issues at this time.  She and husband recently had baby boy in December.  Denies any feelings of depression or feeling overwhelmed and feels safe at home.Marland Kitchen  Has no other concerns at this time.   Updated history tabs and problem list.  OBJECTIVE:   BP 110/80   Pulse 88   Ht 5\' 4"  (1.626 m)   Wt 114 lb (51.7 kg)   SpO2 100%   BMI 19.57 kg/m    Physical Exam Constitutional:      General: She is not in acute distress.    Appearance: She is not ill-appearing.  HENT:     Head: Normocephalic and atraumatic.     Mouth/Throat:     Mouth: Mucous membranes are moist.  Cardiovascular:     Rate and Rhythm: Normal rate and regular rhythm.     Pulses: Normal pulses.  Pulmonary:     Effort: Pulmonary effort is normal.     Breath sounds: Normal breath sounds.  Abdominal:     General: Abdomen is flat. Bowel sounds are normal.     Palpations: Abdomen is soft.     Tenderness: There is no abdominal tenderness.     Comments: Well healing surgical scar  Skin:    General: Skin is warm.  Neurological:     General: No focal deficit present.     Mental Status: She is alert.  Psychiatric:        Mood and Affect: Mood normal.        Behavior: Behavior normal.     ASSESSMENT/PLAN:   Immunization due Patient due for flu shot.  Also had 1st dose of COVID Vaccine confirmed in August of 2021.  Patient amennable to receiving both - Administered Flu Vaccine - Administered 2nd dose of COVID-19 vaccine, monitored for 15 minutes afterward and counseled on possible side effects - Recommend obtaining booster in 6 months   Annual Examination  Recent C-Section delivery in [facility] concerns at home and no feelings of post partum depression.  Blood pressure reviewed and at goal .  Was previously taking Nifedipine during Pregnancy for  Hypertension.  No longer taking.  Blood pressure well controlled.The patient has upcoming appointment with OB/GYN.  Indicates she plans on restarting Depo injections.  Discussed other forms of contraception and encouraged to continue discussion at [facility] visit.  Patient does not believe she has had Pap Smear.  Can get through OB/GYN or obtain from [facility] in future if desires. - Follow up in 1 year or sooner if indicated.    [facility], MD [facility]

## 2020-03-08 DIAGNOSIS — Z419 Encounter for procedure for purposes other than remedying health state, unspecified: Secondary | ICD-10-CM | POA: Diagnosis not present

## 2020-03-09 DIAGNOSIS — Z1151 Encounter for screening for human papillomavirus (HPV): Secondary | ICD-10-CM | POA: Diagnosis not present

## 2020-03-09 DIAGNOSIS — Z124 Encounter for screening for malignant neoplasm of cervix: Secondary | ICD-10-CM | POA: Diagnosis not present

## 2020-04-05 DIAGNOSIS — Z419 Encounter for procedure for purposes other than remedying health state, unspecified: Secondary | ICD-10-CM | POA: Diagnosis not present

## 2020-04-06 ENCOUNTER — Other Ambulatory Visit: Payer: Self-pay | Admitting: Family Medicine

## 2020-04-06 DIAGNOSIS — O99019 Anemia complicating pregnancy, unspecified trimester: Secondary | ICD-10-CM

## 2020-05-06 DIAGNOSIS — Z419 Encounter for procedure for purposes other than remedying health state, unspecified: Secondary | ICD-10-CM | POA: Diagnosis not present

## 2020-06-05 DIAGNOSIS — Z419 Encounter for procedure for purposes other than remedying health state, unspecified: Secondary | ICD-10-CM | POA: Diagnosis not present

## 2020-06-28 DIAGNOSIS — Z111 Encounter for screening for respiratory tuberculosis: Secondary | ICD-10-CM | POA: Diagnosis not present

## 2020-06-30 DIAGNOSIS — Z111 Encounter for screening for respiratory tuberculosis: Secondary | ICD-10-CM | POA: Diagnosis not present

## 2020-07-06 DIAGNOSIS — Z419 Encounter for procedure for purposes other than remedying health state, unspecified: Secondary | ICD-10-CM | POA: Diagnosis not present

## 2020-07-11 DIAGNOSIS — F4323 Adjustment disorder with mixed anxiety and depressed mood: Secondary | ICD-10-CM | POA: Diagnosis not present

## 2020-07-29 DIAGNOSIS — F4323 Adjustment disorder with mixed anxiety and depressed mood: Secondary | ICD-10-CM | POA: Diagnosis not present

## 2020-08-05 DIAGNOSIS — Z419 Encounter for procedure for purposes other than remedying health state, unspecified: Secondary | ICD-10-CM | POA: Diagnosis not present

## 2020-09-05 DIAGNOSIS — Z419 Encounter for procedure for purposes other than remedying health state, unspecified: Secondary | ICD-10-CM | POA: Diagnosis not present

## 2020-09-10 DIAGNOSIS — T162XXA Foreign body in left ear, initial encounter: Secondary | ICD-10-CM | POA: Diagnosis not present

## 2020-09-10 DIAGNOSIS — H938X2 Other specified disorders of left ear: Secondary | ICD-10-CM | POA: Diagnosis not present

## 2020-10-06 DIAGNOSIS — Z419 Encounter for procedure for purposes other than remedying health state, unspecified: Secondary | ICD-10-CM | POA: Diagnosis not present

## 2020-11-05 DIAGNOSIS — Z419 Encounter for procedure for purposes other than remedying health state, unspecified: Secondary | ICD-10-CM | POA: Diagnosis not present

## 2020-11-23 DIAGNOSIS — F411 Generalized anxiety disorder: Secondary | ICD-10-CM | POA: Diagnosis not present

## 2020-11-23 DIAGNOSIS — F339 Major depressive disorder, recurrent, unspecified: Secondary | ICD-10-CM | POA: Diagnosis not present

## 2020-12-06 DIAGNOSIS — Z419 Encounter for procedure for purposes other than remedying health state, unspecified: Secondary | ICD-10-CM | POA: Diagnosis not present

## 2021-01-05 DIAGNOSIS — Z419 Encounter for procedure for purposes other than remedying health state, unspecified: Secondary | ICD-10-CM | POA: Diagnosis not present

## 2021-02-05 DIAGNOSIS — Z419 Encounter for procedure for purposes other than remedying health state, unspecified: Secondary | ICD-10-CM | POA: Diagnosis not present

## 2021-02-05 NOTE — L&D Delivery Note (Addendum)
LABOR COURSE Christie Fowler is a 21 y.o. female G2P1001 with IUP at [redacted]w[redacted]d by LMP = [redacted]w[redacted]d [place] presenting for IOL due to IUGR and TOLAC. Augmentation included foley balloon and pitocin.   Delivery Note Called to room and patient was complete and pushing. Head delivered OA to ROA. No nuchal cord present. Shoulder and body delivered in usual fashion. At  0052 a viable and healthy female infant was delivered via Vaginal, Spontaneous (Presentation:OA;ROA).  Infant with spontaneous cry, placed on mother's abdomen, dried and stimulated. Cord clamped x 2 after 1-minute delay, and cut by FOB. Cord blood drawn. Placenta delivered spontaneously with gentle cord traction. Appears intact. Fundus firm with massage and Pitocin. Labia, perineum, vagina, and cervix inspected with right labial laceration found.    APGAR: 8, 9; weight    Cord: 3VC with the following complications: N/A.    Anesthesia: Epidural Episiotomy: None Lacerations: Right Labial Laceration Suture Repair: 4.0 Vicryl Est. Blood Loss (mL): 81  Mom to postpartum.  Baby to Couplet care / Skin to Skin.  Cashion, Cyndra Numbers, MD 10/12/2021 1:34 AM    Fellow ATTESTATION  I was present and gloved for this delivery and agree with the above documentation in the resident's note except as below.   Celedonio Savage, MD/[facility] [employer] (Faculty Practice) 10/12/2021, 7:20 AM

## 2021-02-14 ENCOUNTER — Ambulatory Visit (INDEPENDENT_AMBULATORY_CARE_PROVIDER_SITE_OTHER): Payer: Medicaid Other

## 2021-02-14 ENCOUNTER — Encounter: Payer: Self-pay | Admitting: Student

## 2021-02-14 ENCOUNTER — Other Ambulatory Visit: Payer: Self-pay

## 2021-02-14 ENCOUNTER — Ambulatory Visit (INDEPENDENT_AMBULATORY_CARE_PROVIDER_SITE_OTHER): Payer: Medicaid Other | Admitting: Student

## 2021-02-14 VITALS — BP 108/65 | HR 64 | Ht 64.0 in | Wt 107.6 lb

## 2021-02-14 DIAGNOSIS — W57XXXA Bitten or stung by nonvenomous insect and other nonvenomous arthropods, initial encounter: Secondary | ICD-10-CM | POA: Diagnosis not present

## 2021-02-14 DIAGNOSIS — Z3009 Encounter for other general counseling and advice on contraception: Secondary | ICD-10-CM | POA: Insufficient documentation

## 2021-02-14 DIAGNOSIS — Z23 Encounter for immunization: Secondary | ICD-10-CM | POA: Diagnosis not present

## 2021-02-14 LAB — POCT URINE PREGNANCY: Preg Test, Ur: NEGATIVE

## 2021-02-14 MED ORDER — NORGESTIMATE-ETH ESTRADIOL 0.25-35 MG-MCG PO TABS: 1.0000 | ORAL_TABLET | Freq: Every day | ORAL | 11 refills | Status: DC

## 2021-02-14 NOTE — Assessment & Plan Note (Signed)
Pt appears to have bug bites on her of unknown origin. No concern for scabies or allergic reaction. Photos in media tab. She can obtain some OTC Hydrocortisone cream to assist with itching. I recommended to check the dog in the house for fleas and her bed for bug bites.

## 2021-02-14 NOTE — Progress Notes (Signed)
° ° °  SUBJECTIVE:   CHIEF COMPLAINT / HPI:   Birth Control:   Christie Fowler is a 21 y.o. female who presents for contraception counseling. The patient has no complaints today. The patient is sexually active. Pertinent past medical history: none. Pt denies tobacco use, HTN (other than in pregnancy), hx of thromboembolism, h/o blood disorder. Pt is married with one child. She has just stopped breastfeeding and would like to have a reliable source of contraception. She has never had STI testing before and would like to get tested today. Pt is very interested in the nexplanon, has tried the depo in the past.   Menstrual History: OB History     Gravida  1   Para  1   Term  1   Preterm      AB      Living  1      SAB      IAB      Ectopic      Multiple  0   Live Births  1           Menarche age: started have periods again after breast feeding on October 13th 2022.  Patient's last menstrual period was 01/24/2021.   Healthcare Maintenance: Needs pap smear  Bumps: Pt reports that she has two bumps, one below the left ribcage and another on right ribcage. She recently moved in with her sister three months ago, which is when she started to see the areas appear. Her sister has a dog, but does not think he has fleas. She is using the same bed that she previously used, but has been using different detergent and soaps. The two bumps are very itchy. She does not think she has bedbugs.   Review of Systems Pertinent items are noted in HPI.   PERTINENT  PMH / PSH:  None  OBJECTIVE:   BP 108/65    Pulse 64    Ht 5\' 4"  (1.626 m)    Wt 48.8 kg    LMP 01/24/2021    SpO2 100%    BMI 18.47 kg/m   General: Alert and oriented in no apparent distress Heart: Regular rate and rhythm with no murmurs appreciated Lungs: normal WOB Skin: Warm and dry, two healing areas, one over left flank and another under right breast on flank. Left is raised and healing and right is healing with  excoriation marks, both approx one mm.  Extremities: No lower extremity edema   Lab: Upreg neg  ASSESSMENT/PLAN:   Birth control counseling Discussed birth control options, and pt has decided on a Nexplanon insertion. Risks and benefits discussed. We will set her up to have the Nexplanon placed on next visit. Additionally, I will order RPR, Hep C, and HIV for STI testing. Pt needs a pap smear as well and can get GC/chlamydia testing on the visit. Patient was set up appt for pap and Nexplanon. Provided patient with COCs prior to Nexplanon insertion.   Bug bites Pt appears to have bug bites on her of unknown origin. No concern for scabies or allergic reaction. Photos in media tab. She can obtain some OTC Hydrocortisone cream to assist with itching. I recommended to check the dog in the house for fleas and her bed for bug bites.      01/26/2021, MD [facility]

## 2021-02-14 NOTE — Patient Instructions (Addendum)
It was a pleasure to see you today! Thank you for choosing [facility] for your primary care. Christie Fowler was seen for birth control counseling.   Our plans for today were: Schedule for Nexplanon placement  Schedule for pap smear  Will order oral contraceptive to take until you get your Nexplanon  I will call about your blood tests Use hydrocortisone cream over the bites    Best,  [place] [employer]

## 2021-02-14 NOTE — Assessment & Plan Note (Signed)
Discussed birth control options, and pt has decided on a Nexplanon insertion. Risks and benefits discussed. We will set her up to have the Nexplanon placed on next visit. Additionally, I will order RPR, Hep C, and HIV for STI testing. Pt needs a pap smear as well and can get GC/chlamydia testing on the visit. Patient was set up appt for pap and Nexplanon. Provided patient with COCs prior to Nexplanon insertion.

## 2021-02-16 ENCOUNTER — Other Ambulatory Visit: Payer: Self-pay | Admitting: Student

## 2021-02-16 ENCOUNTER — Encounter: Payer: Self-pay | Admitting: Student

## 2021-02-17 LAB — HCV INTERPRETATION

## 2021-02-17 LAB — HIV ANTIBODY (ROUTINE TESTING W REFLEX): HIV Screen 4th Generation wRfx: NONREACTIVE

## 2021-02-17 LAB — T PALLIDUM ANTIBODY, EIA: T pallidum Antibody, EIA: NEGATIVE

## 2021-02-17 LAB — RPR W/REFLEX TO TREPSURE: RPR: NONREACTIVE

## 2021-02-17 LAB — HCV AB W REFLEX TO QUANT PCR: HCV Ab: <0.1 s/co ratio (ref 0.0–0.9)

## 2021-02-18 LAB — SPECIMEN STATUS REPORT

## 2021-02-18 LAB — RPR QUALITATIVE: RPR Ser Ql: NONREACTIVE

## 2021-02-26 NOTE — Progress Notes (Signed)
° ° °  SUBJECTIVE:   CHIEF COMPLAINT / HPI:   Prenatal Counseling: Initially, pt presented for Nexplanon placement but was found to have a positive pregnancy test in clinic. She reports LMP around 01/24/21. She has been nauseous over the last few days and had an episode of lightheadedness at the hair dresser approximately 2 weeks ago. She is shocked by the news and wants to discuss this privately with her family. Recently gave birth to baby girl approximately 1 year ago (G57P1). She did not fill her COC prescription from last appt.   PERTINENT  PMH / PSH:  Patient Active Problem List   Diagnosis Date Noted   Positive pregnancy test 02/27/2021   Birth control counseling 02/14/2021   Bug bites 02/14/2021   Immunization due 03/05/2020   Normal labor and delivery 02/01/2020     OBJECTIVE:   BP 111/75    Pulse 81    Ht 5\' 4"  (1.626 m)    Wt 108 lb 6.4 oz (49.2 kg)    LMP 01/24/2021 (Exact Date)    SpO2 100%    BMI 18.61 kg/m    Physical Exam Psychiatric:        Attention and Perception: Attention normal.        Mood and Affect: Mood is anxious.        Behavior: Behavior normal. Behavior is cooperative.        Cognition and Memory: Cognition normal.  General: Alert and oriented in no apparent distress Lungs: Normal WOB Skin: No rashes or erythema  Extremities: No lower extremity edema     ASSESSMENT/PLAN:   Positive pregnancy test Positive pregnancy test found in clinic. Discussed with pt that there are options for termination in state of [place]. List of available options and resources provided to the patient. If she would like to continue with prenatal care, she has an appt scheduled on 03/07/21 that we can change to an initial OB visit. As she was unaware of this previously and wanted privacy to discuss this with her family, told the patient that if she had any questions to call the clinic or mychart message me, and I would assist her with whatever she decides.      Erskine Emery,  MD [place]

## 2021-02-27 ENCOUNTER — Encounter: Payer: Self-pay | Admitting: Student

## 2021-02-27 ENCOUNTER — Ambulatory Visit (INDEPENDENT_AMBULATORY_CARE_PROVIDER_SITE_OTHER): Payer: Medicaid Other | Admitting: Student

## 2021-02-27 ENCOUNTER — Other Ambulatory Visit: Payer: Self-pay

## 2021-02-27 VITALS — BP 111/75 | HR 81 | Ht 64.0 in | Wt 108.4 lb

## 2021-02-27 DIAGNOSIS — Z3201 Encounter for pregnancy test, result positive: Secondary | ICD-10-CM | POA: Diagnosis not present

## 2021-02-27 DIAGNOSIS — Z30017 Encounter for initial prescription of implantable subdermal contraceptive: Secondary | ICD-10-CM

## 2021-02-27 LAB — POCT URINE PREGNANCY: Preg Test, Ur: POSITIVE — AB

## 2021-02-27 NOTE — Assessment & Plan Note (Signed)
Positive pregnancy test found in clinic. Discussed with pt that there are options for termination in state of [place]. List of available options and resources provided to the patient. If she would like to continue with prenatal care, she has an appt scheduled on 03/07/21 that we can change to an initial OB visit. As she was unaware of this previously and wanted privacy to discuss this with her family, told the patient that if she had any questions to call the clinic or mychart message me, and I would assist her with whatever she decides.

## 2021-02-27 NOTE — Patient Instructions (Addendum)
It was a pleasure to see you today! Thank you for choosing [facility] for your primary care.   Our plans for today were: I am providing you with abortion resources today Please schedule a follow up if you would like prenatal care   Please follow up as needed. Please call me at the clinic or message me on my chart if you need anything.   Best,  Erskine Emery

## 2021-03-02 ENCOUNTER — Encounter: Payer: Self-pay | Admitting: Student

## 2021-03-07 ENCOUNTER — Inpatient Hospital Stay (HOSPITAL_COMMUNITY)
Admission: AD | Admit: 2021-03-07 | Discharge: 2021-03-07 | Disposition: A | Discharge status: Home / Self Care | Payer: Medicaid Other | Attending: Obstetrics and Gynecology | Admitting: Obstetrics and Gynecology

## 2021-03-07 ENCOUNTER — Encounter (HOSPITAL_COMMUNITY): Payer: Self-pay | Admitting: Obstetrics and Gynecology

## 2021-03-07 ENCOUNTER — Other Ambulatory Visit: Payer: Self-pay

## 2021-03-07 ENCOUNTER — Ambulatory Visit: Payer: Medicaid Other | Admitting: Student

## 2021-03-07 DIAGNOSIS — Z20822 Contact with and (suspected) exposure to covid-19: Secondary | ICD-10-CM | POA: Diagnosis not present

## 2021-03-07 DIAGNOSIS — O99511 Diseases of the respiratory system complicating pregnancy, first trimester: Secondary | ICD-10-CM | POA: Diagnosis not present

## 2021-03-07 DIAGNOSIS — J989 Respiratory disorder, unspecified: Secondary | ICD-10-CM | POA: Diagnosis not present

## 2021-03-07 DIAGNOSIS — Z3A01 Less than 8 weeks gestation of pregnancy: Secondary | ICD-10-CM | POA: Insufficient documentation

## 2021-03-07 DIAGNOSIS — J029 Acute pharyngitis, unspecified: Secondary | ICD-10-CM | POA: Diagnosis present

## 2021-03-07 LAB — CBC WITH DIFFERENTIAL/PLATELET
Abs Immature Granulocytes: 0.01 10*3/uL (ref 0.00–0.07)
Basophils Absolute: 0 10*3/uL (ref 0.0–0.1)
Basophils Relative: 1 %
Eosinophils Absolute: 0 10*3/uL (ref 0.0–0.5)
Eosinophils Relative: 0 %
HCT: 35.1 % — ABNORMAL LOW (ref 36.0–46.0)
Hemoglobin: 12 g/dL (ref 12.0–15.0)
Immature Granulocytes: 0 %
Lymphocytes Relative: 22 %
Lymphs Abs: 1.1 10*3/uL (ref 0.7–4.0)
MCH: 29.6 pg (ref 26.0–34.0)
MCHC: 34.2 g/dL (ref 30.0–36.0)
MCV: 86.5 fL (ref 80.0–100.0)
Monocytes Absolute: 0.5 10*3/uL (ref 0.1–1.0)
Monocytes Relative: 10 %
Neutro Abs: 3.3 10*3/uL (ref 1.7–7.7)
Neutrophils Relative %: 67 %
Platelets: 129 10*3/uL — ABNORMAL LOW (ref 150–400)
RBC: 4.06 MIL/uL (ref 3.87–5.11)
RDW: 13.8 % (ref 11.5–15.5)
WBC: 4.9 10*3/uL (ref 4.0–10.5)
nRBC: 0 % (ref 0.0–0.2)

## 2021-03-07 LAB — URINALYSIS, MICROSCOPIC (REFLEX)

## 2021-03-07 LAB — URINALYSIS, ROUTINE W REFLEX MICROSCOPIC
Bilirubin Urine: NEGATIVE
Glucose, UA: NEGATIVE mg/dL
Hgb urine dipstick: NEGATIVE
Ketones, ur: >80 mg/dL — AB
Leukocytes,Ua: NEGATIVE
Nitrite: POSITIVE — AB
Protein, ur: 100 mg/dL — AB
Specific Gravity, Urine: >1.03 — ABNORMAL HIGH (ref 1.005–1.030)
pH: 6 (ref 5.0–8.0)

## 2021-03-07 LAB — RESP PANEL BY RT-PCR (FLU A&B, COVID) ARPGX2
Influenza A by PCR: NEGATIVE
Influenza B by PCR: NEGATIVE
SARS Coronavirus 2 by RT PCR: NEGATIVE

## 2021-03-07 MED ORDER — LACTATED RINGERS IV BOLUS
1000.0000 mL | Freq: Once | INTRAVENOUS | Status: AC
  Administered 2021-03-07: 1000 mL via INTRAVENOUS

## 2021-03-07 MED ORDER — ONDANSETRON HCL 4 MG/2ML IJ SOLN
4.0000 mg | Freq: Once | INTRAMUSCULAR | Status: AC
  Administered 2021-03-07: 4 mg via INTRAVENOUS
  Filled 2021-03-07: qty 2

## 2021-03-07 MED ORDER — ACETAMINOPHEN 500 MG PO TABS
1000.0000 mg | ORAL_TABLET | Freq: Once | ORAL | Status: AC
  Administered 2021-03-07: 1000 mg via ORAL
  Filled 2021-03-07: qty 2

## 2021-03-07 NOTE — Progress Notes (Signed)
Discharge instructions reviewed with pt; pt verbalized understanding.

## 2021-03-07 NOTE — MAU Provider Note (Signed)
Chief Complaint:  Back Pain (/), Headache, and Sore Throat   Event Date/Time   First Provider Initiated Contact with Patient 03/07/21 0250     HPI: Christie Fowler is a 21 y.o. G2P1001 at [redacted]w[redacted]d who presents to maternity admissions reporting severe sore throat and backache x3 days, with headache x2days. Has been afebrile, some nausea but denies vomiting. Has not eaten in 3 days due to the throat pain, has not taken medication because she did not know what to take while pregnant. No sick contacts but did recently start a new job at [employer] so is frequently in contact with the public and coworkers. Denies abdominal pain, vaginal bleeding/discharge or any obstetrical symptoms.   Pregnancy Course: Received care in her last pregnancy at [facility] but has not established care yet (went in to [facility] for a Nexplanon placement and was found to be pregnant).  Past Medical History:  Diagnosis Date   Hypertension    OB History  Gravida Para Term Preterm AB Living  2 1 1     1   SAB IAB Ectopic Multiple Live Births        0 1    # Outcome Date GA Lbr Len/2nd Weight Sex Delivery Anes PTL Lv  2 Current           1 Term 02/01/20 [redacted]w[redacted]d  4 lb 6.6 oz (2 kg) F CS-LTranv Gen  LIV     Birth Comments: SGA female, early term gestation   Past Surgical History:  Procedure Laterality Date   CESAREAN SECTION N/A 02/01/2020   Procedure: CESAREAN SECTION;  Surgeon: Cheri Fowler, MD;  Location: [facility] LD ORS;  Service: Obstetrics;  Laterality: N/A;   NO PAST SURGERIES     Family History  Problem Relation Age of Onset   Hypertension Father    Diabetes Paternal Grandmother    Social History   Tobacco Use   Smoking status: Never   Smokeless tobacco: Never  Substance Use Topics   Alcohol use: Never   Drug use: Never   No Known Allergies Medications Prior to Admission  Medication Sig Dispense Refill Last Dose   acetaminophen (TYLENOL) 500 MG tablet Take 2 tablets (1,000 mg total) by mouth  every 8 (eight) hours. 60 tablet 1    ferrous sulfate 325 (65 FE) MG tablet Take 325 mg by mouth daily with breakfast.      NIFEdipine (PROCARDIA XL/NIFEDICAL-XL) 90 MG 24 hr tablet Take 1 tablet (90 mg total) by mouth daily. 60 tablet 0    norgestimate-ethinyl estradiol (SPRINTEC 28) 0.25-35 MG-MCG tablet Take 1 tablet by mouth daily. 28 tablet 11    oxyCODONE (OXY IR/ROXICODONE) 5 MG immediate release tablet Take 1 tablet (5 mg total) by mouth every 6 (six) hours as needed for severe pain. 30 tablet 0    Prenatal Vit-Fe Fumarate-FA (PRENATAL MULTIVITAMIN) TABS tablet Take 1 tablet by mouth daily at 12 noon.      simethicone (MYLICON) 80 MG chewable tablet Chew 1 tablet (80 mg total) by mouth as needed for flatulence. 30 tablet 0    I have reviewed patient's Past Medical Hx, Surgical Hx, Family Hx, Social Hx, medications and allergies.   ROS:  Review of Systems  Constitutional:  Positive for fatigue. Negative for chills and fever.  HENT:  Positive for sore throat. Negative for congestion.   Eyes:  Negative for visual disturbance.  Respiratory:  Negative for cough and shortness of breath.   Cardiovascular:  Negative for chest  pain.  Gastrointestinal:  Positive for nausea. Negative for abdominal pain, diarrhea and vomiting.  Genitourinary:  Negative for dysuria, urgency and vaginal bleeding.  Musculoskeletal:  Positive for back pain and neck pain.  Neurological:  Positive for headaches. Negative for syncope and light-headedness.   Physical Exam  Patient Vitals for the past 24 hrs:  BP Temp Temp src Pulse Resp SpO2 Height Weight  03/07/21 0229 124/71 99.9 F (37.7 C) Oral (!) 107 17 100 % 5\' 4"  (1.626 m) 105 lb (47.6 kg)   Physical Exam Vitals and nursing note reviewed.  Constitutional:      General: She is not in acute distress.    Appearance: She is well-developed and normal weight. She is not ill-appearing.  HENT:     Head: Normocephalic and atraumatic.     Mouth/Throat:      Mouth: Mucous membranes are moist.     Pharynx: Uvula midline. No pharyngeal swelling, oropharyngeal exudate or uvula swelling.     Tonsils: No tonsillar exudate or tonsillar abscesses.  Eyes:     Pupils: Pupils are equal, round, and reactive to light.  Cardiovascular:     Rate and Rhythm: Tachycardia present.  Pulmonary:     Effort: Pulmonary effort is normal.  Abdominal:     Palpations: Abdomen is soft.     Tenderness: There is no abdominal tenderness.  Musculoskeletal:        General: Normal range of motion.     Cervical back: Normal range of motion.  Skin:    General: Skin is warm and dry.     Capillary Refill: Capillary refill takes less than 2 seconds.  Neurological:     Mental Status: She is alert and oriented to person, place, and time.  Psychiatric:        Mood and Affect: Mood normal.        Behavior: Behavior normal.   Labs: Results for orders placed or performed during the hospital encounter of 03/07/21 (from the past 24 hour(s))  Urinalysis, Routine w reflex microscopic Nasopharyngeal Swab     Status: Abnormal   Collection Time: 03/07/21  2:42 AM  Result Value Ref Range   Color, Urine YELLOW YELLOW   APPearance CLEAR CLEAR   Specific Gravity, Urine >1.030 (H) 1.005 - 1.030   pH 6.0 5.0 - 8.0   Glucose, UA NEGATIVE NEGATIVE mg/dL   Hgb urine dipstick NEGATIVE NEGATIVE   Bilirubin Urine NEGATIVE NEGATIVE   Ketones, ur >80 (A) NEGATIVE mg/dL   Protein, ur 100 (A) NEGATIVE mg/dL   Nitrite POSITIVE (A) NEGATIVE   Leukocytes,Ua NEGATIVE NEGATIVE  Urinalysis, Microscopic (reflex)     Status: Abnormal   Collection Time: 03/07/21  2:42 AM  Result Value Ref Range   RBC / HPF 0-5 0 - 5 RBC/hpf   WBC, UA 6-10 0 - 5 WBC/hpf   Bacteria, UA MANY (A) NONE SEEN   Squamous Epithelial / LPF 0-5 0 - 5   Mucus PRESENT   Resp Panel by RT-PCR (Flu A&B, Covid) Nasopharyngeal Swab     Status: None   Collection Time: 03/07/21  2:43 AM   Specimen: Nasopharyngeal Swab;  Nasopharyngeal(NP) swabs in vial transport medium  Result Value Ref Range   SARS Coronavirus 2 by RT PCR NEGATIVE NEGATIVE   Influenza A by PCR NEGATIVE NEGATIVE   Influenza B by PCR NEGATIVE NEGATIVE  CBC with Differential/Platelet     Status: Abnormal   Collection Time: 03/07/21  2:44 AM  Result Value Ref  Range   WBC 4.9 4.0 - 10.5 K/uL   RBC 4.06 3.87 - 5.11 MIL/uL   Hemoglobin 12.0 12.0 - 15.0 g/dL   HCT 35.1 (L) 36.0 - 46.0 %   MCV 86.5 80.0 - 100.0 fL   MCH 29.6 26.0 - 34.0 pg   MCHC 34.2 30.0 - 36.0 g/dL   RDW 13.8 11.5 - 15.5 %   Platelets 129 (L) 150 - 400 K/uL   nRBC 0.0 0.0 - 0.2 %   Neutrophils Relative % 67 %   Neutro Abs 3.3 1.7 - 7.7 K/uL   Lymphocytes Relative 22 %   Lymphs Abs 1.1 0.7 - 4.0 K/uL   Monocytes Relative 10 %   Monocytes Absolute 0.5 0.1 - 1.0 K/uL   Eosinophils Relative 0 %   Eosinophils Absolute 0.0 0.0 - 0.5 K/uL   Basophils Relative 1 %   Basophils Absolute 0.0 0.0 - 0.1 K/uL   Immature Granulocytes 0 %   Abs Immature Granulocytes 0.01 0.00 - 0.07 K/uL   Imaging:  No results found.  MAU Course: Orders Placed This Encounter  Procedures   Resp Panel by RT-PCR (Flu A&B, Covid) Nasopharyngeal Swab   Urinalysis, Routine w reflex microscopic   CBC with Differential/Platelet   Urinalysis, Microscopic (reflex)   Discharge patient   Meds ordered this encounter  Medications   lactated ringers bolus 1,000 mL   ondansetron (ZOFRAN) injection 4 mg   acetaminophen (TYLENOL) tablet 1,000 mg   MDM: LR bolus, zofran and 1000mg  Tylenol given with good relief of symptoms. Resp panel negative, but discussed this is likely a resp illness and gave safe med list for home treatment. Encouraged her to drink hot/cold drinks to soothe throat and stay hydrated, rest and can return to work when afebrile 24hrs.  Assessment: 1. Respiratory illness   2. [redacted] weeks gestation of pregnancy    Plan: Discharge home in stable condition with return precautions.      Follow-up Information     [facility] Follow up.   Why: to establish routine OB care Contact information: [address] [place] [address] [phone]                 Allergies as of 03/07/2021   No Known Allergies      Medication List     STOP taking these medications    ferrous sulfate 325 (65 FE) MG tablet   NIFEdipine 90 MG 24 hr tablet Commonly known as: PROCARDIA XL/NIFEDICAL-XL   norgestimate-ethinyl estradiol 0.25-35 MG-MCG tablet Commonly known as: Sprintec 28   oxyCODONE 5 MG immediate release tablet Commonly known as: Oxy IR/ROXICODONE   simethicone 80 MG chewable tablet Commonly known as: MYLICON       TAKE these medications    acetaminophen 500 MG tablet Commonly known as: TYLENOL Take 2 tablets (1,000 mg total) by mouth every 8 (eight) hours.   prenatal multivitamin Tabs tablet Take 1 tablet by mouth daily at 12 noon.       Gaylan Gerold, CNM, MSN, [place] Certified Nurse Midwife, [facility]

## 2021-03-07 NOTE — MAU Note (Signed)
Pt reports to MAU stating "I'm in so much pain". "My spine feels like it's out of place and it's affecting my entire body" reports migraine for 2 days. "I know there's something wrong.. I don't think it has anything to do with my pregnancy" pt states she is hurting all over, but her head, throat, and back are the 3 things that are bothering her the most. I haven't eaten in the past 3 days, reports nausea but denies any vomiting. Denies any known exposure to COVID. States she is "too scared" to take any medications for the pain because she is pregnant.   LMP: 01/24/2021  Denies any VB or LOF  Pain score:  Head - 9/10 Back - 7/10 Throat - 10/10

## 2021-03-07 NOTE — Discharge Instructions (Signed)

## 2021-03-08 ENCOUNTER — Ambulatory Visit (HOSPITAL_COMMUNITY)
Admission: EM | Admit: 2021-03-08 | Discharge: 2021-03-08 | Disposition: A | Discharge status: Home / Self Care | Payer: Medicaid Other

## 2021-03-08 ENCOUNTER — Inpatient Hospital Stay (HOSPITAL_COMMUNITY)
Admission: AD | Admit: 2021-03-08 | Discharge: 2021-03-08 | Discharge status: Against Medical Advice | Payer: Medicaid Other | Attending: Obstetrics and Gynecology | Admitting: Obstetrics and Gynecology

## 2021-03-08 ENCOUNTER — Other Ambulatory Visit: Payer: Self-pay

## 2021-03-08 DIAGNOSIS — R519 Headache, unspecified: Secondary | ICD-10-CM | POA: Diagnosis not present

## 2021-03-08 DIAGNOSIS — Z3A01 Less than 8 weeks gestation of pregnancy: Secondary | ICD-10-CM | POA: Insufficient documentation

## 2021-03-08 DIAGNOSIS — J029 Acute pharyngitis, unspecified: Secondary | ICD-10-CM | POA: Diagnosis not present

## 2021-03-08 DIAGNOSIS — M542 Cervicalgia: Secondary | ICD-10-CM | POA: Diagnosis not present

## 2021-03-08 DIAGNOSIS — Z419 Encounter for procedure for purposes other than remedying health state, unspecified: Secondary | ICD-10-CM | POA: Diagnosis not present

## 2021-03-08 DIAGNOSIS — Z5321 Procedure and treatment not carried out due to patient leaving prior to being seen by health care provider: Secondary | ICD-10-CM | POA: Insufficient documentation

## 2021-03-08 DIAGNOSIS — O26891 Other specified pregnancy related conditions, first trimester: Secondary | ICD-10-CM | POA: Diagnosis not present

## 2021-03-08 NOTE — ED Notes (Addendum)
Pt walked out of room stating "I got to go im pregnant b*tch".   Pt being discharged. Not seen by Provider.

## 2021-03-08 NOTE — MAU Note (Addendum)
...  Sissi Ghazarian is a 21 y.o. at [redacted]w[redacted]d here in MAU reporting: Ongoing migraine, neck pain, and a sore throat for the past three days. She states her HA is "not as bad as it has been it's a 5/10 but I'm going to tell you it's a f--kin 13/10 so y'all will do something about it. I'm going to come back in here every f---in day until I have my baby if I keep having this pain. Y'all need to figure out what's wrong. I'm expected to take a tylenol until it kicks in. Don't you think we should figure out what the problem is ma'am? They didn't even test me for strep throat. I'm not leaving up out of here until you tell me what's wrong. I don't get that. I got to tell y'all what to do that's frustrating as f--k. All y'all had to say is go home and take some Tylenol and see how you feel these next two days. Sh-t, because if this is normal I need to go home and cry and sleep."  Negative respiratory panel in MAU. Has not taken any Tylenol today because "I felt like I was going to pop all four of them."   Pain score:  5/10 HA 10/10 throat pain 10/10 neck pain - anterior and posterior

## 2021-03-08 NOTE — MAU Note (Signed)
Went in to introduce myself and assess this patient and she hadn't changed into the hospital gown. I asked her to get changed and I would be back in a few minutes. Patient said "nobody told me to get changed, thank you for at least telling me something." I left the room and immediately saw the patient walking out and she stated, "I am leaving because the service here is terrible." I explained to the patient she was leaving against medical advice, but did not stop to sign any paperwork. Venia Carbon NP notified that patient left.

## 2021-03-08 NOTE — ED Triage Notes (Signed)
Pt presents to urgent care for vomiting and migraine x 3 days.  She reports she is pregnant, not sure how many weeks.

## 2021-03-09 ENCOUNTER — Encounter (HOSPITAL_COMMUNITY): Payer: Self-pay | Admitting: Emergency Medicine

## 2021-03-09 ENCOUNTER — Ambulatory Visit (HOSPITAL_COMMUNITY)
Admission: RE | Admit: 2021-03-09 | Discharge: 2021-03-09 | Disposition: A | Discharge status: Home / Self Care | Payer: Medicaid Other | Attending: Psychiatry | Admitting: Psychiatry

## 2021-03-09 ENCOUNTER — Emergency Department (HOSPITAL_COMMUNITY)
Admission: EM | Admit: 2021-03-09 | Discharge: 2021-03-10 | Disposition: A | Discharge status: Home / Self Care | Payer: Medicaid Other | Attending: Emergency Medicine | Admitting: Emergency Medicine

## 2021-03-09 DIAGNOSIS — Z743 Need for continuous supervision: Secondary | ICD-10-CM | POA: Diagnosis not present

## 2021-03-09 DIAGNOSIS — G4489 Other headache syndrome: Secondary | ICD-10-CM | POA: Diagnosis not present

## 2021-03-09 DIAGNOSIS — R45851 Suicidal ideations: Secondary | ICD-10-CM | POA: Diagnosis not present

## 2021-03-09 DIAGNOSIS — T391X2A Poisoning by 4-Aminophenol derivatives, intentional self-harm, initial encounter: Secondary | ICD-10-CM | POA: Insufficient documentation

## 2021-03-09 DIAGNOSIS — Z5321 Procedure and treatment not carried out due to patient leaving prior to being seen by health care provider: Secondary | ICD-10-CM | POA: Insufficient documentation

## 2021-03-09 DIAGNOSIS — R4589 Other symptoms and signs involving emotional state: Secondary | ICD-10-CM | POA: Diagnosis not present

## 2021-03-09 DIAGNOSIS — F332 Major depressive disorder, recurrent severe without psychotic features: Secondary | ICD-10-CM | POA: Insufficient documentation

## 2021-03-09 DIAGNOSIS — R1084 Generalized abdominal pain: Secondary | ICD-10-CM | POA: Diagnosis not present

## 2021-03-09 DIAGNOSIS — T391X1A Poisoning by 4-Aminophenol derivatives, accidental (unintentional), initial encounter: Secondary | ICD-10-CM | POA: Insufficient documentation

## 2021-03-09 DIAGNOSIS — F29 Unspecified psychosis not due to a substance or known physiological condition: Secondary | ICD-10-CM | POA: Diagnosis not present

## 2021-03-09 NOTE — ED Notes (Signed)
Christie Fowler [phone] mother would like updates

## 2021-03-09 NOTE — ED Provider Notes (Addendum)
Complete MSE process was not completed.   Pt was dropped off by EMS and attempted to elope prior to triage. She left the department after vocalizing she was unhappy about the conditions of triage including the fact that there was no TV.   I very briefly spoke with the patient outside. She states she is not suicidal and had no intention of harming herself. She states she had a migraine and wanted to help her headache so she took two tylenol earlier today. She took two more tablets about 30 minutes later and then took one more about 30 minutes after that. During this brief conversation a family member came to pick the patient up and she promptly eloped. At no point did she communicate suicidal intentions to me.   Pt was ambulatory with clear speech and steady gait.   5:21 PM I spoke with [facility] provider Margorie John, PA-C. He states that the patient was suicidal earlier today thought was not at the time of evaluation. She was recommended for inpatient treatment and was agreeable with the plan to transfer to the ED for medical clearance and be admitted for psychiatric care. He states he would pursue IVC given his evaluation   11:11 PM Received update from [employer] P-AC. IVC paperwork has been filled out and has been sent to the magistrate.    Rodney Booze, PA-C 03/09/21 2240    Rodney Booze, PA-C 03/10/21 0415    Ripley Fraise, MD 03/11/21 [phone]

## 2021-03-09 NOTE — ED Provider Notes (Incomplete Revision)
Complete MSE process was not completed.   Pt was dropped off by EMS and attempted to elope prior to triage. She left the department after vocalizing she was unhappy about the conditions of triage including the fact that there was no TV.   I very briefly spoke with the patient outside. She states she is not suicidal and had no intention of harming herself. She states she had a migraine and wanted to help her headache so she took two tylenol earlier today. She took two more tablets about 30 minutes later and then took one more about 30 minutes after that. During this brief conversation a family member came to pick the patient up and she promptly eloped. At no point did she communicate suicidal intentions to me.   Pt was ambulatory with clear speech and steady gait.   5:21 PM I spoke with [facility] provider Margorie John, PA-C. He states that the patient was suicidal earlier today thought was not at the time of evaluation. She was recommended for inpatient treatment and was agreeable with the plan to transfer to the ED for medical clearance and be admitted for psychiatric care. He states he would pursue IVC given his evaluation   11:11 PM Received update from [employer] P-AC. IVC paperwork has been filled out and has been sent to the magistrate.    Tivis Ringer, Zayli Villafuerte S, PA-C 03/09/21 2240

## 2021-03-09 NOTE — ED Notes (Signed)
Patient left triage and ran outside. PA notified.

## 2021-03-09 NOTE — H&P (Addendum)
Behavioral Health Medical Screening Exam  Visit Diagnoses:   -MDD (major depressive disorder), recurrent severe, without psychosis ([place])  -Suicidal ideation  -Tylenol ingestion   HPI:   Christie Fowler is a 21 year old female with reported past psychiatric history of depression who presents to the [facility]) as a voluntary walk-in accompanied by her mother Christie Fowler [phone]) and 31-year-old daughter for symptoms of worsening depression and suicidal ideation.  With patient's consent, patient's mother and daughter present during the evaluation and information was obtained from the patient and patient's mother during the evaluation.  Patient reports that she recently found out in January 2023 that she is pregnant.  Per chart review, patient had a positive POCT urine pregnancy test on 02/27/2021.  Patient reports that she does not know the gestational age of the fetus.  Patient states "what ultimately brought me in is that I wanted to kill myself earlier this morning".  Patient reports that prior to today, she had never had suicidal thoughts before.  Patient states that she has been extremely depressed for the past 2 months, but she states that her depression progressed to the point that she developed suicidal ideation earlier today on 03/09/2021.  Patient states that earlier today on 03/09/2021 while she was at home with her daughter, her depression escalated to the point that she did not want to be around her daughter, began throwing her daughter's toys, did not care about her daughter or the fetus, and developed suicidal ideation with plan of wanting to wrap something around her neck.  Patient states that at that time earlier today on 03/09/2021, she then left her daughter in the living room with her daughter's toys and then went into her bedroom and "was looking for anything to wrap around my neck like a curtain, belt, or rope".  Patient states that she was ultimately at peace  at that time with wanting to end her life and states that at that time, she thought that trying to end her life was the best decision for her.  Patient states that she did not grab anything around her neck at that time or at any point today, but patient does report that she then took 6 500 mg Tylenol tablets (patient states that she initially took 3 tablets and then 3 more tablets 5 to 10 minutes later after taking the first 3 tablets) around 1:00 PM earlier today on 03/09/2021.  When patient is asked about her intentions behind this Tylenol ingestion, patient states "I wasn't trying to end it, but it was the easiest way". She denies any additional ingestions within the past 24 hours. Patient endorses having a generalized headache over the past few days as well as generalized decline in energy over the past few months.  She denies any fall, head injury, loss of consciousness, lightheadedness, dizziness, vision changes, chest pain, shortness of breath, abdominal pain, nausea, vomiting, fever, chills, or any additional physical symptoms currently on exam or within the past 24 hours.  Vital signs stable on exam: Temp afebrile at 98.4 F, BP 115/73, pulse 98, SPO2 100% on room air, respirations 14.  Patient denies SI currently on exam.  She reports that the last time she experienced suicidal ideation was the suicidal ideation that she experienced earlier today on 03/09/2021 noted above.  Patient denies history of any previous suicide attempts.  She denies history of self-injurious behavior via intentionally cutting or burning herself. Patient denies HI currently on exam, but does endorse previous recent  HI of wanting to kill her boyfriend's sister, her boyfriend's sister's husband, and her boyfriend's sister's husband's children by physically beating them to death. She denies AVH. She denies paranoia, but does state that sometimes she is suspicious that people are "plotting against me". She describes her sleep as poor,  stating "I don't sleep", but does not provide details regarding number of hours of sleep per night. She endorses anhedonia, as well as feelings of guilt, hopelessness, and worthlessness. She endorses decline in energy and concentration. She endorses decline in appetite and states that she has not eaten in 3 days. Patient also endorses 10 pound weight loss over the past week.   Patient denies having an outpatient psychiatrist or therapist at this time.  She denies taking any psychotropic medications or any home medications at this time.  She reports that she was previously taking citalopram, but she states that she stopped taking this medication 1 month ago.  Per chart review, patient does have a previous prescription of citalopram 20 mg p.o. daily from October 2022.  Patient denies history of any previous inpatient psychiatric hospitalizations.  Patient's mother reports that the patient is "basically homeless" at this time, but patient reports that she is currently living with her 74-year-old daughter, the father of her children, the father of her children sister and her 13 year old and 65-year-old children, and father of her children sister's husband.  Patient's mother also reports that the patient is not currently financially stable and cannot currently provide for her child.  Patient denies access to firearms.  Patient denies alcohol, tobacco/nicotine, or illicit substance use.  Patient reports that she and the father of her children both recently started employment at [employer].  Patient reports that she is not currently in school.  She reports that her highest level of education is high school diploma.  She states that her main sources of support are her mother and grandfather.  On exam, patient is sitting upright, well groomed, in no acute distress.  Eye contact is good.  Speech is clear and coherent with normal rate and volume.  Mood is depressed with congruent, tearful affect.  Thought process is  coherent, goal directed, and linear.  Patient is alert and oriented x4, pleasant, cooperative, and answers all questions appropriately during the evaluation.  No indication of patient is responding to internal/external stimuli.  No delusional thought content noted on exam.   Total Time spent with patient: 30 minutes  Psychiatric Specialty Exam:  Presentation  General Appearance: Appropriate for Environment; Well Groomed  Eye Contact:Good  Speech:Clear and Coherent; Normal Rate  Speech Volume:Normal  Handedness:No data recorded  Mood and Affect  Mood:Depressed  Affect:Congruent; Tearful   Thought Process  Thought Processes:Coherent; Goal Directed; Linear  Descriptions of Associations:Intact  Orientation:Full (Time, Place and Person)  Thought Content:Logical  History of Schizophrenia/Schizoaffective disorder:No data recorded Duration of Psychotic Symptoms:No data recorded Hallucinations:Hallucinations: None  Ideas of Reference:None  Suicidal Thoughts:Suicidal Thoughts: -- (Patient denies SI currently, but endorses having Active SI with plan earlier today around 1:00 PM and endorses taking 6 500 mg Tylenol tablets around that time as well (see HPI for details).)  Homicidal Thoughts:Homicidal Thoughts: -- (Patient denies HI currently on exam, but does endorse previous recent HI of wanting to kill her boyfriend's sister, her boyfriend's sister's husband, and her boyfriend's sister's husband's children by beating them to death (see HPI for details).)   Sensorium  Memory:Immediate Good; Recent Good; Remote Good  Judgment:Poor  Insight:Lacking; Shallow   Executive  Functions  Concentration:Good  Attention Span:Good  [place]  Language:Good   Psychomotor Activity  Psychomotor Activity:Psychomotor Activity: Normal   Assets  Assets:Communication Skills; Desire for Improvement; Financial Resources/Insurance; Housing; Leisure Time;  Physical Health; Resilience; Social Support; Transportation; Vocational/Educational   Sleep  Sleep:Sleep: Poor    Physical Exam: Physical Exam Vitals reviewed.  Constitutional:      General: She is not in acute distress.    Appearance: She is not ill-appearing, toxic-appearing or diaphoretic.  HENT:     Head: Normocephalic and atraumatic.     Right Ear: External ear normal.     Left Ear: External ear normal.     Nose: Nose normal.     Mouth/Throat:     Comments: Patient wears braces. Eyes:     General: No scleral icterus.       Right eye: No discharge.        Left eye: No discharge.     Conjunctiva/sclera: Conjunctivae normal.  Cardiovascular:     Rate and Rhythm: Normal rate.  Pulmonary:     Effort: Pulmonary effort is normal. No respiratory distress.  Musculoskeletal:        General: Normal range of motion.     Cervical back: Normal range of motion.  Neurological:     General: No focal deficit present.     Mental Status: She is alert and oriented to person, place, and time.     Comments: No tremor noted.   Psychiatric:        Attention and Perception: Attention and perception normal. She does not perceive auditory or visual hallucinations.        Mood and Affect: Mood is depressed.        Speech: Speech normal.        Behavior: Behavior normal. Behavior is not agitated, slowed, aggressive, withdrawn, hyperactive or combative. Behavior is cooperative.        Thought Content: Thought content is not paranoid.     Comments: Affect mood congruent and tearful.    Review of Systems  Constitutional:  Positive for malaise/fatigue and weight loss. Negative for chills, diaphoresis and fever.  HENT:  Negative for congestion.   Respiratory:  Negative for cough and shortness of breath.   Cardiovascular:  Negative for chest pain and palpitations.  Gastrointestinal:  Negative for abdominal pain, constipation, diarrhea, nausea and vomiting.  Musculoskeletal:  Negative for joint  pain and myalgias.  Neurological:  Positive for headaches. Negative for dizziness and loss of consciousness.  Psychiatric/Behavioral:  Positive for depression and suicidal ideas. Negative for hallucinations, memory loss and substance abuse. The patient has insomnia. The patient is not nervous/anxious.   All other systems reviewed and are negative. Patient endorses having a generalized headache over the past few days as well as generalized decline in energy over the past few months.  She denies any fall, head injury, loss of consciousness, lightheadedness, dizziness, vision changes, chest pain, shortness of breath, abdominal pain, nausea, vomiting, fever, chills, or any additional physical symptoms currently on exam or within the past 24 hours.   Vitals: Blood pressure 115/73, pulse 98, temperature 98.4 F (36.9 C), temperature source Oral, resp. rate 14, last menstrual period 01/24/2021, SpO2 100 %, unknown if currently breastfeeding. There is no height or weight on file to calculate BMI.  Musculoskeletal: Strength & Muscle Tone: within normal limits Gait & Station: normal Patient leans: N/A   Recommendations:  Based on my evaluation the patient does not appear to  have an emergency medical condition.  Based on patient's current presentation (including recent SI with plan earlier today on 03/09/2021 and what appears to be a potential suicide attempt/self harming behavior of overdose/ingestion of Tylenol earlier today on 03/09/2021-see HPI for details) and my evaluation of the patient, the patient's depressive symptoms appear to be severely decompensated to the point that the patient is experiencing significant difficulty with functioning in her activities of daily living and the patient appears to be a danger to herself at this time.  Thus, based on these factors, the patient meets inpatient psychiatric treatment criteria at this time.  Recommend inpatient psychiatric treatment for the patient once  medically cleared.  With that being said, although patient does not appear to be experiencing an emergency medical condition at this time based on patient's current presentation, my evaluation of the patient, patient's vital signs, and patient's ROS (see HPI/PE/ROS for details), recommend that the patient receive further medical clearance/evaluation/work-up before patient can receive inpatient psychiatric treatment due to the patient's Tylenol ingestion that occurred earlier today on 03/09/2021 noted above in conjunction with patient's current pregnancy status.  Thus, will transfer the patient to [facility] emergency department for further medical clearance and observation/safety monitoring and social work will seek placement for inpatient psychiatric treatment for the patient while the patient is in the emergency department once the patient is medically cleared.  Patient is voluntary at this time and states that she wants to receive inpatient psychiatric treatment.  Patient is agreeable to the above plan of transfer to the ED for medical clearance and inpatient psychiatric treatment once medically cleared.  Provider report given to Dr. Darl Householder [facility] ED) via phone, who has agreed to accept the patient.  Provider report also given to [facility] emergency department charge RN [facility]) via phone.  EMTALA form completed.  Patient to be transported to from [facility] to [facility] ED via nonemergent EMS.  Patient's mother to follow EMS via privately owned vehicle to the emergency department.   03/09/2021 2224 Update: This provider was notified by Christie Fowler emergency department EDP (Christie Fowler, [place]) that patient eloped from the ED prior to being triaged after patient arrived to the ED via EMS.  Due to patient reportedly refusing further treatment at this time, based on the details of my evaluation noted above, and due to the safety factors mentioned above and patient currently being a danger to herself at this time and  experiencing d severe decompensation of her depression, the patient meets IVC criteria at this time.  This provider has petitioned the patient for IVC/filed IVC paperwork for the patient (this provider has completed first examination paperwork and affidavit and petition form for IVC paperwork).  This provider has confirmed via phone with [facility] that the magistrate has received all necessary IVC paperwork for the patient at this time.    Prescilla Sours, PA-C 03/09/2021, 11:57 PM

## 2021-03-09 NOTE — ED Triage Notes (Signed)
Patient arrived with EMS from behavior health reports intentional overdose of 6 tabs of Tylenol at 1 pm this afternoon .

## 2021-03-13 ENCOUNTER — Telehealth: Payer: Self-pay

## 2021-03-13 ENCOUNTER — Other Ambulatory Visit: Payer: Self-pay | Admitting: Family Medicine

## 2021-03-13 NOTE — Telephone Encounter (Signed)
Patient's mother calls nurse line requesting to speak with Dr. Zigmund Daniel. Attempted to gather more information, however, mother states that Dr. Zigmund Daniel knows what this is pertaining to.   Will forward to PCP.   Requesting returned phone call at [phone].  Talbot Grumbling, RN

## 2021-03-23 ENCOUNTER — Other Ambulatory Visit (HOSPITAL_COMMUNITY)
Admission: RE | Admit: 2021-03-23 | Discharge: 2021-03-23 | Disposition: A | Discharge status: Home / Self Care | Payer: Medicaid Other | Source: Ambulatory Visit | Attending: Family Medicine | Admitting: Family Medicine

## 2021-03-23 ENCOUNTER — Other Ambulatory Visit: Payer: Self-pay

## 2021-03-23 ENCOUNTER — Ambulatory Visit (INDEPENDENT_AMBULATORY_CARE_PROVIDER_SITE_OTHER): Payer: Medicaid Other | Admitting: Student

## 2021-03-23 VITALS — BP 96/60 | HR 91 | Wt 105.6 lb

## 2021-03-23 DIAGNOSIS — O0990 Supervision of high risk pregnancy, unspecified, unspecified trimester: Secondary | ICD-10-CM | POA: Insufficient documentation

## 2021-03-23 DIAGNOSIS — Z3201 Encounter for pregnancy test, result positive: Secondary | ICD-10-CM | POA: Diagnosis not present

## 2021-03-23 DIAGNOSIS — Z3481 Encounter for supervision of other normal pregnancy, first trimester: Secondary | ICD-10-CM | POA: Diagnosis not present

## 2021-03-23 DIAGNOSIS — Z3491 Encounter for supervision of normal pregnancy, unspecified, first trimester: Secondary | ICD-10-CM | POA: Diagnosis not present

## 2021-03-23 DIAGNOSIS — Z3490 Encounter for supervision of normal pregnancy, unspecified, unspecified trimester: Secondary | ICD-10-CM | POA: Insufficient documentation

## 2021-03-23 MED ORDER — DOXYLAMINE-PYRIDOXINE 10-10 MG PO TBEC: 2.0000 | DELAYED_RELEASE_TABLET | Freq: Every day | ORAL | 0 refills | Status: DC

## 2021-03-23 NOTE — Patient Instructions (Addendum)
It was wonderful to see you today. Thank you for allowing me to be a part of your care. Below is a short summary of what we discussed at your visit today:  Early pregnancy Take prenatal multivitamin Come back in 4 weeks for next appointment Start baby aspirin 81 mg daily at [redacted] weeks gestation (mid-March) Today we obtained labs. If the results are normal, I will send you a letter or MyChart message. If the results are abnormal, I will give you a call.    Emergency Planning If you experience any vaginal bleeding, leakage of fluids, don't feel your baby moving as much, or start to have contractions less than 5 minutes apart please go directly to the Maternal Assessment Unit at [facility] for evaluation.  Maternity and women's care services located on the [place] side of [facility] [place] [place]. [facility] (Entrance [address] off [address]  [address] [place],  [place]  [address]     Maternal Mental Health If you start to develop the below symptoms of depression, please reach out to [place] for an appointment. There is also a Biomedical scientist Health Hotline at [phone]). This hotline has trained counselors, doulas, and midwifes to real-time support, information, and resources.  Feeling sad or hopeless most of the time Lack of interest in things you used to enjoy Less interest in caring for yourself (dressing, fixing hair) Trouble concentrating Trouble coping with daily tasks Constant worry about your baby Sleeping or eating too much or too little Feeling very anxious or nervous Unexplained irritability or anger Unwanted or scary thoughts Feeling that you are not a good mother Thoughts of hurting yourself or your baby  If you feel you are experiencing a mental health crisis, please reach out to the National Suicide Prevention Hotline at 1-800-273-TALK (616)387-4803) or go directly to the [facility],  open 24/7.  [phone] [address] [place], [place] [address]   Please bring all of your medications to every appointment!  If you have any questions or concerns, please do not hesitate to contact us via phone or MyChart message.

## 2021-03-23 NOTE — Progress Notes (Addendum)
Patient Name: Christie Fowler Date of Birth: [birth year] [place] Initial Prenatal Visit  Christie Fowler is a 21 y.o. year old G2P1001 at [redacted]w[redacted]d who presents for her initial prenatal visit. Pregnancy is not planned She reports morning sickness and nausea. Taken Diclegis before, and it seems to be helpful  She is not taking a prenatal vitamin. She is interesting in starting one.  She denies pelvic pain or vaginal bleeding.   Pregnancy Dating: The patient is dated by LMP LMP: 01/24/21.  Period is certain:  Yes.  Periods were fairly regular prior to pregnancy  LMP was not a typical period  Using hormonal contraception in 3 months prior to conception: No  PMH: Reviewed and as detailed below: Received last OBGYN care at [facility]  HTN: No  Gestational Hypertension/preeclampsia: PreE w/ SF, 24hrs MgSo4 postop.  Type 1 or 2 Diabetes: No  Depression:  Yes  Seizure disorder:  No VTE: No  History of STI No,  Abnormal Pap smear: Patient reports that she does not remember ever having a pap smear Genital herpes simplex:  No   PSH:  Surgical history reviewed, notable for: None remarkable   Obstetric History: Obstetric history tab updated and reviewed.  Summary of prior pregnancies: c-section due to FHT decelerations, went into spontaneous labor  Cesarean delivery: Yes  Gestational Diabetes:  Yes Hypertension in pregnancy: Yes History of preterm birth: 37wks  History of LGA/SGA infant: Only other child born at [place]  History of shoulder dystocia: No Indications for referral were reviewed, and the patient has no obstetric indications for referral to [facility] at this time.   Social History: Partner's name: Tavis   Tobacco use: No Alcohol use:  No Other substance use:  No  Current Medications:  None, will start prenatal  Reviewed and appropriate in pregnancy.   Genetic and Infection Screen: Flow Sheet Updated No   Prenatal  Exam: Gen: Well nourished, well developed.  No distress.  Vitals noted. HEENT: Normocephalic, atraumatic.  Neck supple without cervical lymphadenopathy, thyromegaly or thyroid nodules.  Fair dentition. CV: RRR no murmur, gallops or rubs Lungs: CTA B.  Normal respiratory effort without wheezes or rales. Abd: soft, NTND. +BS.  Uterus not appreciated above pelvis. GU: Normal external female genitalia without lesions.  Nl vaginal, well rugated without lesions. No vaginal discharge.  Bimanual exam: No adnexal mass or TTP. No CMT.  Uterus size, below pubic symphysis  Ext: No clubbing, cyanosis or edema. Psych: Normal grooming and dress.  Not depressed or anxious appearing.  Normal thought content and process without flight of ideas or looseness of associations   Assessment/Plan:  Christie Fowler is a 22 y.o. G2P1001 at [redacted]w[redacted]d who presents to initiate prenatal care. She is doing well.  Current pregnancy issues include h/o of depression and PreE with SF.  Routine prenatal care: As dating is not reliable without regular periods, a dating ultrasound has been ordered. Dating tab updated. Pre-pregnancy weight updated. Expected weight gain this pregnancy is 25-35 pounds  Meets criteria for ASA-will start at 12 weeks  Indications for referral to [place] were reviewed and the patient does not meet criteria for referral.  Medication list reviewed and updated.  Recommended patient see a dentist for regular care.  Bleeding and pain precautions reviewed. Importance of prenatal vitamins reviewed.  Genetic screening offered. Patient opted for: no screening. The patient has the following indications for aspirinto begin 81 mg at 12-16 weeks: One high risk condition:  History of preeclampsia MORE than one moderate risk condition: identifies as African American  Aspirin was  recommended today based upon above risk factors (one high risk condition or more than one moderate risk factor)  The patient will not be age 36 or  over at time of delivery. Referral to genetic counseling was offered today.  The patient has the following risk factors for preexisting diabetes: Reviewed indications for early 1 hour glucose testing, not indicated . An early 1 hour glucose tolerance test was not ordered.   2. Pregnancy issues include the following which were addressed today:  Indications for ASA therapy (per uptodate) One of the following: H/O preeclampsia, especially early onset/adverse outcome No Multifetal gestation No CHTN No T1DM or T2DM No Chronic kidney disease No Autoimmune disease (antiphospholipid syndrome, systemic lupus erythematosus) No  OR Two or more of the following: Nulliparity Yes Obesity (BMI>30 kg/m2) No Family h/o preeclampsia in mother or sister Yes Age ?35 years No Sociodemographic characteristics (African American race, low socioeconomic level) Yes Personal risk factors (eg, previous pregnancy w/ LBW or SGA, previous adverse pregnancy outcome [eg, stillbirth], interval >10 years between pregnancies) Yes  Indications for early A1C (per uptodate) BMI >=25 (>=23 in Asian women) AND one of the following GDM in a previous pregnancy No Previous A1C?5.7, impaired glucose tolerance, or impaired fasting glucose on previous testing No First-degree relative with diabetes Yes --GM High-risk race/ethnicity (eg, African American, Latino, Native American, [place] American, Pacific Islander) Yes History of cardiovascular disease No HTN or on therapy for hypertension No HDL cholesterol level <35 mg/dL (0.90 mmol/L) and/or a triglyceride level >250 mg/dL (2.82 mmol/L) No PCOS No Physical inactivity No Other clinical condition associated with insulin resistance (eg, severe obesity, acanthosis nigricans) No Previous birth of an infant weighing ?4000 g No Previous stillbirth of unknown cause No >= 40yo No   Patient will need PHQ9, Ucx, UA on next visit.  Follow up 4 weeks for next prenatal visit.

## 2021-03-24 LAB — CYTOLOGY - PAP
Adequacy: ABSENT
Chlamydia: NEGATIVE
Comment: NEGATIVE
Comment: NORMAL
Diagnosis: NEGATIVE
Neisseria Gonorrhea: NEGATIVE

## 2021-03-24 LAB — SPECIMEN STATUS REPORT

## 2021-03-27 LAB — CBC/D/PLT+RPR+RH+ABO+RUBIGG...
Antibody Screen: NEGATIVE
Basophils Absolute: 0 10*3/uL (ref 0.0–0.2)
Basos: 1 %
EOS (ABSOLUTE): 0.1 10*3/uL (ref 0.0–0.4)
Eos: 2 %
HCV Ab: NONREACTIVE
HIV Screen 4th Generation wRfx: NONREACTIVE
Hematocrit: 32 % — ABNORMAL LOW (ref 34.0–46.6)
Hemoglobin: 10.6 g/dL — ABNORMAL LOW (ref 11.1–15.9)
Hepatitis B Surface Ag: NEGATIVE
Immature Grans (Abs): 0 10*3/uL (ref 0.0–0.1)
Immature Granulocytes: 0 %
Lymphocytes Absolute: 1.7 10*3/uL (ref 0.7–3.1)
Lymphs: 43 %
MCH: 29.7 pg (ref 26.6–33.0)
MCHC: 33.1 g/dL (ref 31.5–35.7)
MCV: 90 fL (ref 79–97)
Monocytes Absolute: 0.3 10*3/uL (ref 0.1–0.9)
Monocytes: 7 %
Neutrophils Absolute: 1.8 10*3/uL (ref 1.4–7.0)
Neutrophils: 47 %
Platelets: 228 10*3/uL (ref 150–450)
RBC: 3.57 x10E6/uL — ABNORMAL LOW (ref 3.77–5.28)
RDW: 13.9 % (ref 11.7–15.4)
RPR Ser Ql: NONREACTIVE
Rh Factor: POSITIVE
Rubella Antibodies, IGG: 3.94 index (ref >0.99)
WBC: 3.9 10*3/uL (ref 3.4–10.8)

## 2021-03-27 LAB — HGB FRACTIONATION CASCADE
Hgb A2: 2.4 % (ref 1.8–3.2)
Hgb A: 97.6 % (ref 96.4–98.8)
Hgb F: 0 % (ref 0.0–2.0)
Hgb S: 0 %

## 2021-03-27 LAB — HCV INTERPRETATION

## 2021-03-29 ENCOUNTER — Encounter (HOSPITAL_COMMUNITY): Payer: Self-pay | Admitting: Psychiatry

## 2021-03-29 ENCOUNTER — Other Ambulatory Visit: Payer: Self-pay

## 2021-03-29 ENCOUNTER — Ambulatory Visit (INDEPENDENT_AMBULATORY_CARE_PROVIDER_SITE_OTHER): Payer: Medicaid Other | Admitting: Psychiatry

## 2021-03-29 VITALS — BP 115/75 | HR 98 | Ht 64.0 in | Wt 106.0 lb

## 2021-03-29 DIAGNOSIS — F331 Major depressive disorder, recurrent, moderate: Secondary | ICD-10-CM

## 2021-03-30 ENCOUNTER — Ambulatory Visit: Admission: RE | Admit: 2021-03-30 | Payer: Medicaid Other | Source: Ambulatory Visit
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
## 2021-04-02 ENCOUNTER — Encounter: Payer: Self-pay | Admitting: Student

## 2021-04-05 DIAGNOSIS — Z419 Encounter for procedure for purposes other than remedying health state, unspecified: Secondary | ICD-10-CM | POA: Diagnosis not present

## 2021-04-20 ENCOUNTER — Other Ambulatory Visit: Payer: Self-pay

## 2021-04-20 ENCOUNTER — Ambulatory Visit (INDEPENDENT_AMBULATORY_CARE_PROVIDER_SITE_OTHER): Payer: Medicaid Other | Admitting: Student

## 2021-04-20 VITALS — BP 95/60 | HR 86 | Wt 104.2 lb

## 2021-04-20 DIAGNOSIS — Z3201 Encounter for pregnancy test, result positive: Secondary | ICD-10-CM | POA: Diagnosis not present

## 2021-04-20 DIAGNOSIS — Z3491 Encounter for supervision of normal pregnancy, unspecified, first trimester: Secondary | ICD-10-CM | POA: Diagnosis not present

## 2021-04-20 DIAGNOSIS — Z23 Encounter for immunization: Secondary | ICD-10-CM

## 2021-04-20 MED ORDER — ASPIRIN EC 81 MG PO TBEC: 81.0000 mg | DELAYED_RELEASE_TABLET | Freq: Every day | ORAL | 11 refills | Status: DC

## 2021-04-20 NOTE — Patient Instructions (Addendum)
It was a pleasure to see you today! Thank you for choosing [facility] for your primary care. Christie Fowler was seen for OB visit.  ? ?Our plans for today were: ?Start ASA after dating ultrasound  ?Continue with Diclegis  ?Currently -- dating based on your last period is [redacted]w[redacted]d ?I will call about your Urine test results  ? ? ?You should return to our clinic to see [place] in 4 weeks  ? ?Best,  ?Ethanael Veith  ? ? ? ? ? ?Emergency Planning ?If you experience any vaginal bleeding, leakage of fluids, don't feel your baby moving as much, or start to have contractions less than 5 minutes apart please go directly to the Maternal Assessment Unit at [facility] for evaluation. ?  ?Maternity and women's care services located on the [place] side of [facility] [place]. [facility] (Entrance C off [address]  ?[address] ?[place],  [place]  [address] ?  ?  ?  ?Maternal Mental Health ?If you start to develop the below symptoms of depression, please reach out to [place] for an appointment. There is also a Biomedical scientist Health Hotline at [phone]). This hotline has trained counselors, doulas, and midwifes to real-time support, information, and resources.  ?Feeling sad or hopeless most of the time ?Lack of interest in things you used to enjoy ?Less interest in caring for yourself (dressing, fixing hair) ?Trouble concentrating ?Trouble coping with daily tasks ?Constant worry about your baby ?Sleeping or eating too much or too little ?Feeling very anxious or nervous ?Unexplained irritability or anger ?Unwanted or scary thoughts ?Feeling that you are not a good mother ?Thoughts of hurting yourself or your baby ?  ?If you feel you are experiencing a mental health crisis, please reach out to the National Suicide Prevention Hotline at 1-800-273-TALK (605)419-0228) or go directly to the [facility]gent Care, open 24/7.  ?[phone] ?[address] [place], [place] [address]  ?  ?Please bring all of your medications to every appointment! ?  ?If you have any questions or concerns, please do not hesitate to contact us via phone or MyChart message.  ?

## 2021-04-20 NOTE — Progress Notes (Signed)
?Patient Name: Christie Fowler ?Date of Birth: 05/02/2000 ?[facility] Prenatal Visit ? ?Christie Fowler is a 21 y.o. G2P1001 at [redacted]w[redacted]d here for routine follow up. She is dated by LMP, dating ultrasound scheduled.  She reports headache, no contractions, no cramping, and no leaking.  She denies vaginal bleeding, gush of blood.  See flow sheet for details. Eating two times a day, nausea and vomiting improved greatly with Diclegis.  ? ?Vitals:  ? 04/20/21 1426  ?BP: 95/60  ?Pulse: 86  ? ?Depression screen [facility] 2/9 03/23/2021 02/27/2021 02/14/2021  ?Decreased Interest 0 0 0  ?Down, Depressed, Hopeless 0 0 1  ?PHQ - 2 Score 0 0 1  ?Altered sleeping 0 0 1  ?Tired, decreased energy 0 0 1  ?Change in appetite 1 0 1  ?Feeling bad or failure about yourself  0 0 0  ?Trouble concentrating 0 0 0  ?Moving slowly or fidgety/restless 0 0 0  ?Suicidal thoughts 0 0 0  ?PHQ-9 Score 1 0 4  ?Difficult doing work/chores Not difficult at all - -  ?Some encounter information is confidential and restricted. Go to Review Flowsheets activity to see all data.  ? ?Physical Exam ?Vitals reviewed.  ?Constitutional:   ?   General: She is not in acute distress. ?   Appearance: Normal appearance. She is normal weight. She is not ill-appearing.  ?HENT:  ?   Right Ear: External ear normal.  ?   Left Ear: External ear normal.  ?   Nose: Nose normal.  ?   Mouth/Throat:  ?   Mouth: Mucous membranes are moist.  ?Eyes:  ?   General:     ?   Right eye: No discharge.     ?   Left eye: No discharge.  ?   Conjunctiva/sclera: Conjunctivae normal.  ?Cardiovascular:  ?   Rate and Rhythm: Normal rate and regular rhythm.  ?Pulmonary:  ?   Effort: Pulmonary effort is normal. No respiratory distress.  ?   Breath sounds: Normal breath sounds. No stridor.  ?Abdominal:  ?   General: Bowel sounds are normal.  ?   Tenderness: There is no guarding.  ?Musculoskeletal:     ?   General: Normal range of motion.  ?   Cervical back: Normal range of motion.  ?Skin: ?    General: Skin is warm.  ?Neurological:  ?   Mental Status: She is alert.  ?Psychiatric:     ?   Mood and Affect: Mood normal.     ?   Behavior: Behavior normal.  ? ? ?A/P: Pregnancy at [redacted]w[redacted]d.  Doing well.   ?Routine Prenatal Care:  ?Dating reviewed, dating tab is correct ?Fetal heart tones -- too early to detect with uncertain dating based on LMP, U/S ordered  ?Influenza vaccine administered today.   ?COVID vaccination was discussed and has already been given.  ?The patient has the following indication for screening preexisting diabetes: Reviewed indications for early 1 hour glucose testing, not indicated . ?Anatomy ultrasound to be scheduled at 18-20 weeks. ?Patient is not interested in genetic screening.  ?Pregnancy education including expected weight gain in pregnancy, OTC medication use, continued use of prenatal vitamin, smoking cessation if applicable, and nutrition in pregnancy.   ?Bleeding and pain precautions reviewed. ?The patient has the following indications for aspirin to begin 81 mg at 12-16 weeks: ?One high risk condition: History of preeclampsia ?MORE than one moderate risk condition: identifies as African American  ?Aspirin was recommended today  based upon above risk factors (one high risk condition or more than one moderate risk factor)  ? ?2. Pregnancy issues include the following and were addressed as appropriate today: ? ?Morning sickness -- Taking Diclegis with great relief  ?Patient did not go to her dating u/s -- ordered a dating U/S again and scheduled in clinic for 03/27 @8AM   ?Start ASA after dating ultrasound -- ordered to pharmacy  ?UA and Ucx ordered today -- will call with results  ?Problem list and pregnancy box updated: Yes.  ?FHT at next visit  ? ?Follow up 4 weeks. ? ? ?

## 2021-05-01 ENCOUNTER — Ambulatory Visit: Admission: RE | Admit: 2021-05-01 | Payer: Medicaid Other | Source: Ambulatory Visit

## 2021-05-04 ENCOUNTER — Ambulatory Visit (INDEPENDENT_AMBULATORY_CARE_PROVIDER_SITE_OTHER): Payer: Medicaid Other | Admitting: Obstetrics and Gynecology

## 2021-05-04 VITALS — BP 128/66 | HR 91 | Wt 109.0 lb

## 2021-05-04 DIAGNOSIS — Z3A14 14 weeks gestation of pregnancy: Secondary | ICD-10-CM

## 2021-05-04 DIAGNOSIS — Z98891 History of uterine scar from previous surgery: Secondary | ICD-10-CM

## 2021-05-04 DIAGNOSIS — O09299 Supervision of pregnancy with other poor reproductive or obstetric history, unspecified trimester: Secondary | ICD-10-CM | POA: Diagnosis not present

## 2021-05-04 DIAGNOSIS — Z3480 Encounter for supervision of other normal pregnancy, unspecified trimester: Secondary | ICD-10-CM | POA: Diagnosis not present

## 2021-05-04 DIAGNOSIS — O0990 Supervision of high risk pregnancy, unspecified, unspecified trimester: Secondary | ICD-10-CM

## 2021-05-04 NOTE — Progress Notes (Signed)
? ?  PRENATAL VISIT NOTE ? ?Subjective:  ?Christie Fowler is a 21 y.o. G2P1001 at [redacted]w[redacted]d being seen today for ongoing prenatal care.  She is currently monitored for the following issues for this high-risk pregnancy and has Bug bites; MDD (major depressive disorder), recurrent severe, without psychosis ([place]); Suicidal ideation; Tylenol ingestion; Supervision of high risk pregnancy, antepartum; Hx of preeclampsia, prior pregnancy, currently pregnant; and History of cesarean section on their problem list. ? ?Patient doing well with no acute concerns today. She reports no complaints.  Contractions: Not present. Vag. Bleeding: None.   . Denies leaking of fluid.  ? ?Pt seen as transfer of care from [facility] practice.  Chart reviewed and pt had severe preeclampsia with primary cesarean section.  Pt does desire TOLAC at this time, info given.  She did desire waterbirth, but per guidelines, she is not a candidate ? ?The following portions of the patient's history were reviewed and updated as appropriate: allergies, current medications, past family history, past medical history, past social history, past surgical history and problem list. Problem list updated. ? ?Objective:  ? ?Vitals:  ? 05/04/21 1501  ?BP: 128/66  ?Pulse: 91  ?Weight: 109 lb (49.4 kg)  ? ? ?Fetal Status: Fetal Heart Rate (bpm): 155 Fundal Height: 14 cm      ? ?General:  Alert, oriented and cooperative. Patient is in no acute distress.  ?Skin: Skin is warm and dry. No rash noted.   ?Cardiovascular: Normal heart rate noted  ?Respiratory: Normal respiratory effort, no problems with respiration noted  ?Abdomen: Soft, gravid, appropriate for gestational age.  Pain/Pressure: Absent     ?Pelvic: Cervical exam deferred        ?Extremities: Normal range of motion.  Edema: None  ?Mental Status:  Normal mood and affect. Normal behavior. Normal judgment and thought content.  ? ?Assessment and Plan:  ?Pregnancy: G2P1001 at [redacted]w[redacted]d ? ?1. Supervision of high risk pregnancy,  antepartum ?Continue routine care ?- Culture, OB Urine ?- [place] MFM OB COMP + 14 WK; Future ? ?2. [redacted] weeks gestation of pregnancy ? ? ?3. Hx of preeclampsia, prior pregnancy, currently pregnant ?Pt advised to start baby ASA,  ?Will complete baseline preeclampsia labs due to her hx ? ?- Comprehensive metabolic panel ?- [place] MFM OB COMP + 14 WK; Future ?- Protein / creatinine ratio, urine ? ?4. History of cesarean section ?TOLAC consent given for patient to review ?- [place] MFM OB COMP + 14 WK; Future ? ?Preterm labor symptoms and general obstetric precautions including but not limited to vaginal bleeding, contractions, leaking of fluid and fetal movement were reviewed in detail with the patient. ? ?Please refer to After Visit Summary for other counseling recommendations.  ? ?Return in about 4 weeks (around 06/01/2021) for in person, [facility]. ? ? ?Lynnda Shields, MD ?Faculty Attending ?[facility] [place] ?  ?

## 2021-05-04 NOTE — Patient Instructions (Signed)
?  Considering Waterbirth? ?Guide for patients at [facility] ([facility]) ?Why consider waterbirth? ?Gentle birth for babies  ?Less pain medicine used in labor  ?May allow for passive descent/less pushing  ?May reduce perineal tears  ?More mobility and instinctive maternal position changes  ?Increased maternal relaxation  ? ?Is waterbirth safe? What are the risks of infection, drowning or other complications? ?Infection:  ?Very low risk (3.7 % for tub vs 4.8% for bed)  ?7 in 8000 waterbirths with documented infection  ?Poorly cleaned equipment most common cause  ?Slightly lower group B strep transmission rate  ?Drowning  ?Maternal:  ?Very low risk  ?Related to seizures or fainting  ?Newborn:  ?Very low risk. No evidence of increased risk of respiratory problems in multiple large studies  ?Physiological protection from breathing under water  ?Avoid underwater birth if there are any fetal complications  ?Once baby's head is out of the water, keep it out.  ?Birth complication  ?Some reports of cord trauma, but risk decreased by bringing baby to surface gradually  ?No evidence of increased risk of shoulder dystocia. Mothers can usually change positions faster in water than in a bed, possibly aiding the maneuvers to free the shoulder.  ? ?There are 2 things you MUST do to have a waterbirth with [facility]: ?Attend a waterbirth class at [facility]   ?3rd Wednesday of every month from 7-9 pm (virtual during COVID) ?Free ?Register online at [email or web address] or [email or web address] or by calling [phone] ?Bring us the certificate from the class to your prenatal appointment or send via MyChart ?Meet with a midwife at 36 weeks* to see if you can still plan a waterbirth and to sign the consent.  ? ?*We also recommend that you schedule as many of your prenatal visits with a midwife as possible.   ? ?Helpful information: ?You may want to bring a bathing suit top to the hospital  to wear during labor but this is optional.  All other supplies are provided by the hospital. ?Please arrive at the hospital with signs of active labor, and do not wait at home until late in labor. It takes 45 min- 2 hours for COVID testing, fetal monitoring, and check in to your room to take place, plus transport and filling of the waterbirth tub.   ? ?Things that would prevent you from having a waterbirth: ?Unknown or Positive COVID-19 diagnosis upon admission to hospital* ?Premature, <37wks  ?Previous cesarean birth  ?Presence of thick meconium-stained fluid  ?Multiple gestation (Twins, triplets, etc.)  ?Uncontrolled diabetes or gestational diabetes requiring medication  ?Hypertension diagnosed in pregnancy or preexisting hypertension (gestational hypertension, preeclampsia, or chronic hypertension) ?Fetal growth restriction (your baby measures less than 10th percentile on ultrasound) ?Heavy vaginal bleeding  ?Non-reassuring fetal heart rate  ?Active infection (MRSA, etc.). Group B Strep is NOT a contraindication for waterbirth.  ?If your labor has to be induced and induction method requires continuous monitoring of the baby's heart rate  ?Other risks/issues identified by your obstetrical provider  ? ?Please remember that birth is unpredictable. Under certain unforeseeable circumstances your provider may advise against giving birth in the tub. These decisions will be made on a case-by-case basis and with the safety of you and your baby as our highest priority. ? ? ?*Please remember that in order to have a waterbirth, you must test Negative to COVID-19 upon admission to the hospital. ? ?Updated 05/16/20 ? ?

## 2021-05-05 LAB — COMPREHENSIVE METABOLIC PANEL
ALT: 8 IU/L (ref 0–32)
AST: 14 IU/L (ref 0–40)
Albumin/Globulin Ratio: 1.7 (ref 1.2–2.2)
Albumin: 4.3 g/dL (ref 3.9–5.0)
Alkaline Phosphatase: 43 IU/L — ABNORMAL LOW (ref 44–121)
BUN/Creatinine Ratio: 19 (ref 9–23)
BUN: 10 mg/dL (ref 6–20)
Bilirubin Total: 0.7 mg/dL (ref 0.0–1.2)
CO2: 21 mmol/L (ref 20–29)
Calcium: 9.6 mg/dL (ref 8.7–10.2)
Chloride: 106 mmol/L (ref 96–106)
Creatinine, Ser: 0.53 mg/dL — ABNORMAL LOW (ref 0.57–1.00)
Globulin, Total: 2.5 g/dL (ref 1.5–4.5)
Glucose: 57 mg/dL — ABNORMAL LOW (ref 70–99)
Potassium: 4.1 mmol/L (ref 3.5–5.2)
Sodium: 141 mmol/L (ref 134–144)
Total Protein: 6.8 g/dL (ref 6.0–8.5)
eGFR: 134 mL/min/{1.73_m2} (ref >59)

## 2021-05-06 DIAGNOSIS — Z419 Encounter for procedure for purposes other than remedying health state, unspecified: Secondary | ICD-10-CM | POA: Diagnosis not present

## 2021-05-06 LAB — PROTEIN / CREATININE RATIO, URINE
Creatinine, Urine: 87.7 mg/dL
Protein, Ur: 23.3 mg/dL
Protein/Creat Ratio: 266 mg/g creat — ABNORMAL HIGH (ref 0–200)

## 2021-05-08 LAB — URINE CULTURE, OB REFLEX

## 2021-05-08 LAB — CULTURE, OB URINE

## 2021-05-09 ENCOUNTER — Other Ambulatory Visit: Payer: Self-pay | Admitting: Obstetrics & Gynecology

## 2021-05-09 DIAGNOSIS — O2342 Unspecified infection of urinary tract in pregnancy, second trimester: Secondary | ICD-10-CM

## 2021-05-09 MED ORDER — NITROFURANTOIN MONOHYD MACRO 100 MG PO CAPS: 100.0000 mg | ORAL_CAPSULE | Freq: Two times a day (BID) | ORAL | 0 refills | Status: DC

## 2021-05-09 NOTE — Progress Notes (Signed)
Meds ordered this encounter  Medications   nitrofurantoin, macrocrystal-monohydrate, (MACROBID) 100 MG capsule    Sig: Take 1 capsule (100 mg total) by mouth 2 (two) times daily.    Dispense:  14 capsule    Refill:  0    

## 2021-05-10 ENCOUNTER — Telehealth (INDEPENDENT_AMBULATORY_CARE_PROVIDER_SITE_OTHER): Payer: Medicaid Other | Admitting: Psychiatry

## 2021-05-10 DIAGNOSIS — F332 Major depressive disorder, recurrent severe without psychotic features: Secondary | ICD-10-CM

## 2021-05-10 NOTE — Progress Notes (Signed)
BH MD/PA/NP OP Progress Note ? ?05/10/2021 11:39 AM ?Christie Fowler  ?MRN:  [id number] ? ?Virtual Visit via Video Note ? ?I connected with Christie Fowler on 05/10/21 at 10:30 AM EDT by a video enabled telemedicine application and verified that I am speaking with the correct person using two identifiers. ? ?Location: ?Patient: home ?Provider: offsite ?  ?I discussed the limitations of evaluation and management by telemedicine and the availability of in person appointments. The patient expressed understanding and agreed to proceed. ? ? ?  ?I discussed the assessment and treatment plan with the patient. The patient was provided an opportunity to ask questions and all were answered. The patient agreed with the plan and demonstrated an understanding of the instructions. ?  ?The patient was advised to call back or seek an in-person evaluation if the symptoms worsen or if the condition fails to improve as anticipated. ? ?I provided 10 minutes of non-face-to-face time during this encounter. ? ? ?Mcneil Sobericely Najla Aughenbaugh, NP  ? ?Chief Complaint: Follow-up psychiatric evaluation ? ?HPI: Christie Fowler is is a 21 year [facility].  Patient has a psychiatric history of major depressive disorder and is not currently taking any psychotropic medications at this time due to current pregnancy.  Patient reports that she continues to await her therapy appointment which is on June 01, 2021.  She continues to prefer therapy to manage symptoms as opposed to medication management. ?Patient is alert and oriented x4, calm, pleasant and willing to engage.  Patient is not smiling and reports a good mood.  Patient reports that she is moving into her own space and excited about the move.  Patient reports that her previous stressors have minimized due to her upcoming move.  Patient denies suicidal or homicidal ideations, paranoia, delusional thought, auditory or visual  hallucinations. ? ? ? ? ?Visit Diagnosis:  ?  ICD-10-CM   ?1. MDD (major depressive disorder), recurrent severe, without psychosis (HCC)  F33.2   ?  ? ? ?Past Psychiatric History: Major depressive disorder ? ?Past Medical History:  ?Past Medical History:  ?Diagnosis Date  ? Hypertension   ?  ?Past Surgical History:  ?Procedure Laterality Date  ? CESAREAN SECTION N/A 02/01/2020  ? Procedure: CESAREAN SECTION;  Surgeon: Lavina HammanMeisinger, Todd, MD;  [facility]: [facility];  Service: Obstetrics;  Laterality: N/A;  ? NO PAST SURGERIES    ? ? ?Family Psychiatric History:  ? ?Family History:  ?Family History  ?Problem Relation Age of Onset  ? Hypertension Father   ? Diabetes Paternal Grandmother   ? ? ?Social History:  ?Social History  ? ?Socioeconomic History  ? Marital status: Single  ?  Spouse name: Leafy Kindleavis Rogers  ? Number of children: 1  ? Years of education: Not on file  ? Highest education level: Not on file  ?Occupational History  ? Not on file  ?Tobacco Use  ? Smoking status: Never  ? Smokeless tobacco: Never  ?Substance and Sexual Activity  ? Alcohol use: Never  ? Drug use: Never  ? Sexual activity: Yes  ?Other Topics Concern  ? Not on file  ?Social History Narrative  ? Not on file  ? ?Social Determinants of Health  ? ?Financial Resource Strain: Not on file  ?Food Insecurity: Not on file  ?Transportation Needs: Not on file  ?Physical Activity: Not on file  ?Stress: Not on file  ?Social Connections: Not on file  ? ? ?Allergies: No Known Allergies ? ?Metabolic Disorder  Labs: ?No results found for: HGBA1C, MPG ?No results found for: PROLACTIN ?No results found for: CHOL, TRIG, HDL, CHOLHDL, VLDL, LDLCALC ?No results found for: TSH ? ?Therapeutic Level Labs: ?No results found for: LITHIUM ?No results found for: VALPROATE ?No components found for:  CBMZ ? ?Current Medications: ?Current Outpatient Medications  ?Medication Sig Dispense Refill  ? acetaminophen (TYLENOL) 500 MG tablet Take 2 tablets (1,000 mg total) by mouth every 8  (eight) hours. 60 tablet 1  ? aspirin EC 81 MG tablet Take 1 tablet (81 mg total) by mouth daily. Swallow whole. 30 tablet 11  ? Doxylamine-Pyridoxine (DICLEGIS) 10-10 MG TBEC Take 2 tablets by mouth daily. The dose may be increased to four tablets orally over the course of the day, as needed, for more severe nausea (two tablets at bedtime and add one tablet midmorning and one tablet in midafternoon if needed) 60 tablet 0  ? nitrofurantoin, macrocrystal-monohydrate, (MACROBID) 100 MG capsule Take 1 capsule (100 mg total) by mouth 2 (two) times daily. 14 capsule 0  ? Prenatal Vit-Fe Fumarate-FA (PRENATAL MULTIVITAMIN) TABS tablet Take 1 tablet by mouth daily at 12 noon.    ? ?No current facility-administered medications for this visit.  ? ? ? ?Musculoskeletal: ?Strength & Muscle Tone:  N/A virtual visit ?Gait & Station:  N/A virtual visit ?Patient leans: N/A ? ?Psychiatric Specialty Exam: ?Review of Systems  ?Psychiatric/Behavioral:  Negative for hallucinations, self-injury and suicidal ideas.   ?All other systems reviewed and are negative.  ?Last menstrual period 01/24/2021, unknown if currently breastfeeding.There is no height or weight on file to calculate BMI.  ?General Appearance: fairly groomed  ?Eye Contact:  Good  ?Speech:  Clear and Coherent  ?Volume:  Normal  ?Mood:  Euthymic  ?Affect:  Congruent  ?Thought Process:  Coherent  ?Orientation:  Full (Time, Place, and Person)  ?Thought Content: Logical   ?Suicidal Thoughts:  No  ?Homicidal Thoughts:  No  ?Memory:  Immediate;   Good ?Recent;   Good ?Remote;   Good  ?Judgement:  Good  ?Insight:  Good  ?Psychomotor Activity:  NA  ?Concentration:  Concentration: Good and Attention Span: Good  ?Recall:  Good  ?Fund of Knowledge: Good  ?Language: Good  ?Akathisia:  NA  ?Handed:  Right  ?AIMS (if indicated): not done  ?Assets:  Communication Skills ?Desire for Improvement  ?ADL's:  Intact  ?Cognition: WNL  ?Sleep:  Good  ? ?Screenings: ?PHQ2-9   ? ?Flowsheet Row Office  Visit from 03/29/2021 in [facility] Initial Prenatal from 03/23/2021 in [facility] Office Visit from 02/27/2021 in [facility] Office Visit from 02/14/2021 in [facility] Office Visit from 03/03/2020 in [facility]  ?PHQ-2 Total Score 4 0 0 1 0  ?PHQ-9 Total Score 6 1 0 4 0  ? ?  ? ?Flowsheet Row Office Visit from 03/29/2021 in [facility] ED from 03/09/2021 in [facility] EMERGENCY DEPARTMENT ED from 03/08/2021 in [facility]  ?C-SSRS RISK CATEGORY No Risk High Risk No Risk  ? ?  ? ? ? ?Assessment and Plan: Ha Dore is is a 21 year old female presented to [facility] for follow-up psychiatric evaluation.  Patient has a psychiatric history of major depressive disorder and is not currently taking any psychotropic medications at this time due to current pregnancy.  Patient reports that she continues to await her therapy  appointment which is on June 01, 2021.  She continues to prefer therapy to manage symptoms as opposed to medication management.  No medications ordered today. ? ?MDD (major depressive disorder), recurrent severe, without psychosis (HCC) ? ?Continue therapy ? ?Collaboration of Care: Collaboration of Care: Patient reminded of her therapy appointment on June 01, 2021. ? ? ? ? ?Follow-up as needed if symptoms worsen. ? ?Patient/Guardian was advised Release of Information must be obtained prior to any record release in order to collaborate their care with an outside provider. Patient/Guardian was advised if they have not already done so to contact the registration department to sign all necessary forms in order for [place] to release information regarding their care.  ? ?Consent: Patient/Guardian gives verbal consent for treatment and assignment of benefits for services provided during this visit.  Patient/Guardian expressed understanding and agreed to proceed.  ? ? ?Mcneil Sober, NP ?05/10/2021, 11:39 AM ? ?

## 2021-06-01 ENCOUNTER — Ambulatory Visit (INDEPENDENT_AMBULATORY_CARE_PROVIDER_SITE_OTHER): Payer: Medicaid Other | Admitting: Obstetrics and Gynecology

## 2021-06-01 ENCOUNTER — Encounter: Payer: Self-pay | Admitting: Obstetrics and Gynecology

## 2021-06-01 VITALS — BP 104/67 | HR 76 | Wt 114.0 lb

## 2021-06-01 DIAGNOSIS — Z98891 History of uterine scar from previous surgery: Secondary | ICD-10-CM

## 2021-06-01 DIAGNOSIS — Z3A18 18 weeks gestation of pregnancy: Secondary | ICD-10-CM

## 2021-06-01 DIAGNOSIS — O09299 Supervision of pregnancy with other poor reproductive or obstetric history, unspecified trimester: Secondary | ICD-10-CM

## 2021-06-01 DIAGNOSIS — O0990 Supervision of high risk pregnancy, unspecified, unspecified trimester: Secondary | ICD-10-CM

## 2021-06-01 NOTE — Progress Notes (Signed)
ROB 18.[redacted] wks GA ?Declines AFP  ?Reports itching under C/S scar ? ?Only took 1 dose of Macrobid for E. Coli UTI, vomited ?

## 2021-06-01 NOTE — Progress Notes (Signed)
? ?  PRENATAL VISIT NOTE ? ?Subjective:  ?Christie Fowler is a 21 y.o. G2P1001 at [redacted]w[redacted]d being seen today for ongoing prenatal care.  She is currently monitored for the following issues for this high-risk pregnancy and has Bug bites; MDD (major depressive disorder), recurrent severe, without psychosis ([place]); Suicidal ideation; Tylenol ingestion; Supervision of high risk pregnancy, antepartum; Hx of preeclampsia, prior pregnancy, currently pregnant; and History of cesarean section on their problem list. ? ?Patient reports no complaints.   . Vag. Bleeding: None.  Movement: Present. Denies leaking of fluid.  ? ?The following portions of the patient's history were reviewed and updated as appropriate: allergies, current medications, past family history, past medical history, past social history, past surgical history and problem list.  ? ?Objective:  ? ?Vitals:  ? 06/01/21 1548  ?BP: 104/67  ?Pulse: 76  ?Weight: 114 lb (51.7 kg)  ? ? ?Fetal Status: Fetal Heart Rate (bpm): 146   Movement: Present    ? ?General:  Alert, oriented and cooperative. Patient is in no acute distress.  ?Skin: Skin is warm and dry. No rash noted.   ?Cardiovascular: Normal heart rate noted  ?Respiratory: Normal respiratory effort, no problems with respiration noted  ?Abdomen: Soft, gravid, appropriate for gestational age.  Pain/Pressure: Absent     ?Pelvic: Cervical exam deferred        ?Extremities: Normal range of motion.  Edema: None  ?Mental Status: Normal mood and affect. Normal behavior. Normal judgment and thought content.  ? ?Assessment and Plan:  ?Pregnancy: G2P1001 at [redacted]w[redacted]d ?1. Supervision of high risk pregnancy, antepartum ?Patient is doing well without complaints ?Anatomy ultrasound scheduled next week ? ?2. History of cesarean section ?Will discuss TOLAC ? ?3. Hx of preeclampsia, prior pregnancy, currently pregnant ?Continue ASA ? ?Preterm labor symptoms and general obstetric precautions including but not limited to vaginal bleeding,  contractions, leaking of fluid and fetal movement were reviewed in detail with the patient. ?Please refer to After Visit Summary for other counseling recommendations.  ? ?No follow-ups on file. ? ?Future Appointments  ?Date Time Provider [place]  ?06/05/2021 10:00 AM Cozart, Birdena Jubilee, LCSW [facility] None  ?06/06/2021  9:00 AM [facility] US1 [facility]  ?08/01/2021 10:30 AM Penn, Lunette Stands, NP [facility] None  ? ? ?Mora Bellman, MD ? ?

## 2021-06-03 LAB — CULTURE, OB URINE

## 2021-06-03 LAB — URINE CULTURE, OB REFLEX

## 2021-06-05 ENCOUNTER — Ambulatory Visit (HOSPITAL_COMMUNITY): Payer: Medicaid Other | Admitting: Clinical

## 2021-06-05 DIAGNOSIS — Z419 Encounter for procedure for purposes other than remedying health state, unspecified: Secondary | ICD-10-CM | POA: Diagnosis not present

## 2021-06-06 ENCOUNTER — Ambulatory Visit: Payer: Medicaid Other

## 2021-06-12 ENCOUNTER — Other Ambulatory Visit: Payer: Self-pay | Admitting: *Deleted

## 2021-06-12 ENCOUNTER — Other Ambulatory Visit: Payer: Self-pay | Admitting: Obstetrics and Gynecology

## 2021-06-12 ENCOUNTER — Ambulatory Visit: Payer: Medicaid Other | Attending: Obstetrics and Gynecology

## 2021-06-12 DIAGNOSIS — Z98891 History of uterine scar from previous surgery: Secondary | ICD-10-CM

## 2021-06-12 DIAGNOSIS — O09299 Supervision of pregnancy with other poor reproductive or obstetric history, unspecified trimester: Secondary | ICD-10-CM | POA: Diagnosis not present

## 2021-06-12 DIAGNOSIS — O0990 Supervision of high risk pregnancy, unspecified, unspecified trimester: Secondary | ICD-10-CM

## 2021-06-12 DIAGNOSIS — O4442 Low lying placenta NOS or without hemorrhage, second trimester: Secondary | ICD-10-CM

## 2021-06-26 ENCOUNTER — Encounter: Payer: Medicaid Other | Admitting: Obstetrics and Gynecology

## 2021-06-28 ENCOUNTER — Encounter (HOSPITAL_COMMUNITY): Payer: Self-pay | Admitting: Psychiatry

## 2021-06-28 ENCOUNTER — Telehealth (INDEPENDENT_AMBULATORY_CARE_PROVIDER_SITE_OTHER): Payer: Medicaid Other | Admitting: Psychiatry

## 2021-06-28 DIAGNOSIS — F411 Generalized anxiety disorder: Secondary | ICD-10-CM

## 2021-06-28 MED ORDER — SERTRALINE HCL 25 MG PO TABS: 25.0000 mg | ORAL_TABLET | Freq: Every day | ORAL | 3 refills | Status: DC

## 2021-06-28 NOTE — Progress Notes (Signed)
BH MD/PA/NP OP Progress Note Virtual Visit via Video Note  I connected with [name] on 06/28/21 at  2:30 PM EDT by a video enabled telemedicine application and verified that I am speaking with the correct person using two identifiers.  Location: Patient: Home Provider: Clinic   I discussed the limitations of evaluation and management by telemedicine and the availability of in person appointments. The patient expressed understanding and agreed to proceed.  I provided 30 minutes of non-face-to-face time during this encounter.   06/28/2021 2:59 PM Christie Fowler  MRN:  [id number]  Chief Complaint: "At times anxiety makes it harder for me to leave the home"  HPI: 21 year old female seen today for follow-up psychiatric evaluation.  She has a psychiatric history of depression, anxiety, and SI.  Currently she is not managed on medications however she reports her anxiety has become bothersome.  Today she is well-groomed, pleasant, cooperative, and engaged in conversation.  She informed Clinical research associate that she has been suffering from increased anxiety.  She notes that she worries about her finances, her health, her pregnancy, and her 22-year-old daughter.  She informed Clinical research associate that she fears being financially unstable and losing her apartment.  Recently she was married and reports that her husband is supportive.  Patient notes that at times her anxiety becomes so overwhelming that she has difficulty leaving the house that she has crying spells.  Provider conducted a GAD-7 and patient scored a 13.  Provider also conducted PHQ-9 patient scored a 6.  She endorses adequate appetite.  Patient notes that her sleep is poor noting that she sleeps 4 to 5 hours nightly.  Today she denies SI/HI/VAH, mania, paranoia.  Patient informed Clinical research associate that she would like to go back to school to become a nurse or major in business.  She notes that she would like to accomplish these ventures so that she can better financially care  for her family.  She informed Clinical research associate that it was her deceased grandmother's dream that she go to school to become a Engineer, civil (consulting).  Patient is 5 months pregnant.  Provider discussed risk and benefits of starting medication while pregnant.  At this time patient notes that she will consider taking Zoloft and asked provider to fill a small dose.  Provider agreeable to feeling Zoloft 25 mg daily for patient. Potential side effects of medication and risks vs benefits of treatment vs non-treatment were explained and discussed. All questions were answered.  We will follow-up with outpatient counseling for therapy.  No other concerns at this time.  Visit Diagnosis:    ICD-10-CM   1. Generalized anxiety disorder  F41.1 sertraline (ZOLOFT) 25 MG tablet    Ambulatory referral to Social Work      Past Psychiatric History: Anxiety, depression, SI  Past Medical History:  Past Medical History:  Diagnosis Date   Hypertension     Past Surgical History:  Procedure Laterality Date   CESAREAN SECTION N/A 02/01/2020   Procedure: CESAREAN SECTION;  Surgeon: Lavina Hamman, MD;  Location: [facility] LD [facility]S;  Service: Obstetrics;  Laterality: N/A;   NO PAST SURGERIES      Family Psychiatric History: Father: schizophrenia  Family History:  Family History  Problem Relation Age of Onset   Hypertension Father    Diabetes Paternal Grandmother     Social History:  Social History   Socioeconomic History   Marital status: Single    Spouse name: Leafy Kindle   Number of children: 1   Years of education: Not on  file   Highest education level: Not on file  Occupational History   Not on file  Tobacco Use   Smoking status: Never   Smokeless tobacco: Never  Substance and Sexual Activity   Alcohol use: Never   Drug use: Never   Sexual activity: Yes  Other Topics Concern   Not on file  Social History Narrative   Not on file   Social Determinants of Health   Financial Resource Strain: Not on file  Food  Insecurity: Not on file  Transportation Needs: Not on file  Physical Activity: Not on file  Stress: Not on file  Social Connections: Not on file    Allergies: No Known Allergies  Metabolic Disorder Labs: No results found for: HGBA1C, MPG No results found for: PROLACTIN No results found for: CHOL, TRIG, HDL, CHOLHDL, VLDL, LDLCALC No results found for: TSH  Therapeutic Level Labs: No results found for: LITHIUM No results found for: VALPROATE No components found for:  CBMZ  Current Medications: Current Outpatient Medications  Medication Sig Dispense Refill   sertraline (ZOLOFT) 25 MG tablet Take 1 tablet (25 mg total) by mouth daily. 30 tablet 3   acetaminophen (TYLENOL) 500 MG tablet Take 2 tablets (1,000 mg total) by mouth every 8 (eight) hours. 60 tablet 1   aspirin EC 81 MG tablet Take 1 tablet (81 mg total) by mouth daily. Swallow whole. (Patient not taking: Reported on 06/01/2021) 30 tablet 11   Doxylamine-Pyridoxine (DICLEGIS) 10-10 MG TBEC Take 2 tablets by mouth daily. The dose may be increased to four tablets orally over the course of the day, as needed, for more severe nausea (two tablets at bedtime and add one tablet midmorning and one tablet in midafternoon if needed) (Patient not taking: Reported on 06/01/2021) 60 tablet 0   nitrofurantoin, macrocrystal-monohydrate, (MACROBID) 100 MG capsule Take 1 capsule (100 mg total) by mouth 2 (two) times daily. 14 capsule 0   Prenatal Vit-Fe Fumarate-FA (PRENATAL MULTIVITAMIN) TABS tablet Take 1 tablet by mouth daily at 12 noon.     No current facility-administered medications for this visit.     Musculoskeletal: Strength & Muscle Tone: within normal limits and telehealth visit Gait & Station: normal, telehealth visit Patient leans: N/A  Psychiatric Specialty Exam: Review of Systems  Last menstrual period 01/24/2021, unknown if currently breastfeeding.There is no height or weight on file to calculate BMI.  General Appearance:  Well Groomed  Eye Contact:  Good  Speech:  Clear and Coherent and Normal Rate  Volume:  Normal  Mood:  Anxious and Depressed  Affect:  Appropriate and Congruent  Thought Process:  Coherent, Goal Directed, and Linear  Orientation:  Full (Time, Place, and Person)  Thought Content: WDL and Logical   Suicidal Thoughts:  No  Homicidal Thoughts:  No  Memory:  Immediate;   Good Recent;   Good Remote;   Good  Judgement:  Good  Insight:  Good  Psychomotor Activity:  Normal  Concentration:  Concentration: Good and Attention Span: Good  Recall:  Good  Fund of Knowledge: Good  Language: Good  Akathisia:  No  Handed:  Right  AIMS (if indicated): not done  Assets:  Communication Skills Desire for Improvement Financial Resources/Insurance Housing Intimacy Physical Health Social Support  ADL's:  Intact  Cognition: WNL  Sleep:  Fair   Screenings: GAD-7    Flowsheet Row [facility]  Total GAD-7 Score 13      PHQ2-9  Flowsheet Row Video Visit from 06/28/2021 in [facility] Office Visit from 03/29/2021 in [facility] Initial Prenatal from 03/23/2021 in [facility] Office Visit from 02/27/2021 in [facility] Office Visit from 02/14/2021 in [facility]  PHQ-2 Total Score 4 4 0 0 1  PHQ-9 Total Score 13 6 1  0 4      Flowsheet Row Video Visit from 06/28/2021 in [facility] Office Visit from 03/29/2021 in [facility] ED from 03/09/2021 in [facility] EMERGENCY DEPARTMENT  C-SSRS RISK CATEGORY No Risk No Risk High Risk        Assessment and Plan: Patient endorses symptoms of anxiety, depression, and poor sleep.  Today she is agreeable to starting Zoloft 25 mg daily to help manage symptoms.  Patient referred to outpatient counseling for  therapy.  No other concerns noted at this time.  1. Generalized anxiety disorder  Start- sertraline (ZOLOFT) 25 MG tablet; Take 1 tablet (25 mg total) by mouth daily.  Dispense: 30 tablet; Refill: 3 - Ambulatory referral to Social Work   Collaboration of Care: Collaboration of Care: Other provider involved in patient's care AEB counselor  Patient/Guardian was advised Release of Information must be obtained prior to any record release in order to collaborate their care with an outside provider. Patient/Guardian was advised if they have not already done so to contact the registration department to sign all necessary forms in order for [facility] to release information regarding their care.   Consent: Patient/Guardian gives verbal consent for treatment and assignment of benefits for services provided during this visit. Patient/Guardian expressed understanding and agreed to proceed.   Follow-up in 3 months Follow-up with therapy   [place], NP 06/28/2021, 2:59 PM

## 2021-07-06 DIAGNOSIS — Z419 Encounter for procedure for purposes other than remedying health state, unspecified: Secondary | ICD-10-CM | POA: Diagnosis not present

## 2021-07-09 ENCOUNTER — Inpatient Hospital Stay (HOSPITAL_COMMUNITY)
Admission: AD | Admit: 2021-07-09 | Discharge: 2021-07-09 | Disposition: A | Discharge status: Home / Self Care | Payer: Medicaid Other | Attending: Obstetrics and Gynecology | Admitting: Obstetrics and Gynecology

## 2021-07-09 ENCOUNTER — Encounter (HOSPITAL_COMMUNITY): Payer: Self-pay | Admitting: Obstetrics and Gynecology

## 2021-07-09 DIAGNOSIS — O26892 Other specified pregnancy related conditions, second trimester: Secondary | ICD-10-CM | POA: Insufficient documentation

## 2021-07-09 DIAGNOSIS — Z3A23 23 weeks gestation of pregnancy: Secondary | ICD-10-CM | POA: Insufficient documentation

## 2021-07-09 DIAGNOSIS — M549 Dorsalgia, unspecified: Secondary | ICD-10-CM

## 2021-07-09 DIAGNOSIS — M5432 Sciatica, left side: Secondary | ICD-10-CM | POA: Diagnosis not present

## 2021-07-09 DIAGNOSIS — M5442 Lumbago with sciatica, left side: Secondary | ICD-10-CM | POA: Insufficient documentation

## 2021-07-09 DIAGNOSIS — M25552 Pain in left hip: Secondary | ICD-10-CM | POA: Insufficient documentation

## 2021-07-09 DIAGNOSIS — N858 Other specified noninflammatory disorders of uterus: Secondary | ICD-10-CM | POA: Diagnosis not present

## 2021-07-09 DIAGNOSIS — M791 Myalgia, unspecified site: Secondary | ICD-10-CM | POA: Insufficient documentation

## 2021-07-09 DIAGNOSIS — O34219 Maternal care for unspecified type scar from previous cesarean delivery: Secondary | ICD-10-CM | POA: Diagnosis not present

## 2021-07-09 LAB — URINALYSIS, ROUTINE W REFLEX MICROSCOPIC
Bilirubin Urine: NEGATIVE
Glucose, UA: NEGATIVE mg/dL
Hgb urine dipstick: NEGATIVE
Ketones, ur: NEGATIVE mg/dL
Leukocytes,Ua: NEGATIVE
Nitrite: NEGATIVE
Protein, ur: NEGATIVE mg/dL
Specific Gravity, Urine: 1.021 (ref 1.005–1.030)
pH: 6 (ref 5.0–8.0)

## 2021-07-09 MED ORDER — CYCLOBENZAPRINE HCL 5 MG PO TABS: 5.0000 mg | ORAL_TABLET | Freq: Three times a day (TID) | ORAL | 0 refills | Status: DC | PRN

## 2021-07-09 MED ORDER — ACETAMINOPHEN 500 MG PO TABS
1000.0000 mg | ORAL_TABLET | Freq: Once | ORAL | Status: AC
  Administered 2021-07-09: 1000 mg via ORAL
  Filled 2021-07-09: qty 2

## 2021-07-09 MED ORDER — CYCLOBENZAPRINE HCL 5 MG PO TABS
5.0000 mg | ORAL_TABLET | Freq: Three times a day (TID) | ORAL | Status: DC | PRN
  Administered 2021-07-09: 5 mg via ORAL
  Filled 2021-07-09: qty 1

## 2021-07-09 NOTE — MAU Provider Note (Signed)
History   [id number]  Arrival date and time: 07/09/21 2110    Chief Complaint  Patient presents with   Pelvic Pain   Back Pain     HPI Christie Fowler is a 21 y.o. at [redacted]w[redacted]d who presents for back/hip pain.  She notes horrible discomfort on her left side that started 2 days ago and has gotten progressively worse.  Pain is on her left side- most noticeable when she walks/bends down.  She works at [employer] and notes that she has been doing double shifts.  She was supposed to go to work today, but due to the pain she could not make it. She has not tried anything OTC to relieve her pain.  She denies abdominal pain/contractions. No LOF, no VB.  +FM.  No other acute complaints.  She is concerned about what she is going to do about work.   Prior C-section- wishes to consider TOLAC  OB History     Gravida  2   Para  1   Term  1   Preterm      AB      Living  1      SAB      IAB      Ectopic      Multiple  0   Live Births  1           Past Medical History:  Diagnosis Date   Hypertension     Past Surgical History:  Procedure Laterality Date   CESAREAN SECTION N/A 02/01/2020   Procedure: CESAREAN SECTION;  Surgeon: Lavina Hamman, MD;  Location: [facility];  Service: Obstetrics;  Laterality: N/A;   NO PAST SURGERIES      Family History  Problem Relation Age of Onset   Hypertension Father    Diabetes Paternal Grandmother     No Known Allergies  No current facility-administered medications on file prior to encounter.   Current Outpatient Medications on File Prior to Encounter  Medication Sig Dispense Refill   Prenatal Vit-Fe Fumarate-FA (PRENATAL MULTIVITAMIN) TABS tablet Take 1 tablet by mouth daily at 12 noon.     acetaminophen (TYLENOL) 500 MG tablet Take 2 tablets (1,000 mg total) by mouth every 8 (eight) hours. 60 tablet 1   aspirin EC 81 MG tablet Take 1 tablet (81 mg total) by mouth daily. Swallow whole. (Patient not taking: Reported on  06/01/2021) 30 tablet 11   Doxylamine-Pyridoxine (DICLEGIS) 10-10 MG TBEC Take 2 tablets by mouth daily. The dose may be increased to four tablets orally over the course of the day, as needed, for more severe nausea (two tablets at bedtime and add one tablet midmorning and one tablet in midafternoon if needed) (Patient not taking: Reported on 06/01/2021) 60 tablet 0   nitrofurantoin, macrocrystal-monohydrate, (MACROBID) 100 MG capsule Take 1 capsule (100 mg total) by mouth 2 (two) times daily. 14 capsule 0   sertraline (ZOLOFT) 25 MG tablet Take 1 tablet (25 mg total) by mouth daily. 30 tablet 3     Review of Systems  Constitutional: Negative.   HENT: Negative.    Eyes: Negative.   Respiratory: Negative.    Cardiovascular: Negative.   Gastrointestinal: Negative.   Genitourinary: Negative.   Musculoskeletal:  Positive for back pain and myalgias.  Skin: Negative.   Neurological: Negative.   Endo/Heme/Allergies: Negative.   Psychiatric/Behavioral: Negative.    Pertinent positives and negative per HPI, all others reviewed and negative  Physical Exam   BP 115/66 (BP Location:  Right Arm)   Pulse 82   Temp (!) 97.5 F (36.4 C) (Oral)   Resp 16   Ht 5\' 4"  (1.626 m)   Wt 53.6 kg   LMP 01/24/2021 (Exact Date)   SpO2 100%   BMI 20.27 kg/m   Patient Vitals for the past 24 hrs:  BP Temp Temp src Pulse Resp SpO2 Height Weight  07/09/21 2205 115/66 (!) 97.5 F (36.4 C) Oral 82 16 100 % -- --  07/09/21 2145 -- -- -- -- -- -- 5\' 4"  (1.626 m) 53.6 kg    Physical Exam Constitutional:      Appearance: Normal appearance.  HENT:     Head: Normocephalic.  Eyes:     Extraocular Movements: Extraocular movements intact.  Cardiovascular:     Rate and Rhythm: Normal rate and regular rhythm.     Pulses: Normal pulses.  Abdominal:     Comments: Gravid, soft and non-tender  Musculoskeletal:     Cervical back: Neck supple. No tenderness.     Comments: Left sided back pain with raising of left  leg,  no motor or sensory weakness, no calf tenderness bilaterally, +tenderness on paraspinal muscles  Skin:    General: Skin is warm and dry.  Neurological:     Mental Status: She is alert and oriented to person, place, and time.  Psychiatric:        Mood and Affect: Mood normal.        Behavior: Behavior normal.        Thought Content: Thought content normal.        Judgment: Judgment normal.     Cervical Exam  deferred  FHT Baseline 150 bpm with moderate variability   MAU Course  Procedures Lab Orders         Urinalysis, Routine w reflex microscopic Urine, Clean Catch     Meds ordered this encounter  Medications   acetaminophen (TYLENOL) tablet 1,000 mg   cyclobenzaprine (FLEXERIL) tablet 5 mg   cyclobenzaprine (FLEXERIL) 5 MG tablet    Sig: Take 1 tablet (5 mg total) by mouth 3 (three) times daily as needed for muscle spasms.    Dispense:  15 tablet    Refill:  0   Imaging Orders  No imaging studies ordered today    MDM -exam completed -findings suggestive of back pain in pregnancy/sciatic -given tylenol and flexeril in MAU -FWB reassuring for current gestational age -notes some improvement with medication and rest Assessment and Plan   1. Sciatica of left side   2. [redacted] weeks gestation of pregnancy   3. Back pain affecting pregnancy in second trimester     -Reviewed conservative options -encouraged heat and ice as needed -given note for work -ok to take OTC tyelnol -Rx for 2146 written -Questions and concerns were addressed and agrees with above plan -F/U as scheduled  , DO 07/09/21 10:39 PM

## 2021-07-09 NOTE — MAU Note (Signed)
.  Christie Fowler is a 21 y.o. at [redacted]w[redacted]d here in MAU reporting: lower left pain along with pelvic pain that started 3 days ago. Pt rates pain at a 3 laying down but standing up pt rates pain at a 8. Pt has not taken anything for pain relief. +FM denies LOF or vaginal bleeding.   Onset of complaint: 3 days ago  FHT:155 Lab orders placed from triage:  UA.  Ozan at bedside 2153

## 2021-07-11 ENCOUNTER — Encounter: Payer: Self-pay | Admitting: *Deleted

## 2021-07-12 ENCOUNTER — Encounter: Payer: Self-pay | Admitting: Medical

## 2021-07-12 ENCOUNTER — Ambulatory Visit (INDEPENDENT_AMBULATORY_CARE_PROVIDER_SITE_OTHER): Payer: Medicaid Other | Admitting: Medical

## 2021-07-12 VITALS — BP 122/65 | HR 97 | Wt 119.4 lb

## 2021-07-12 DIAGNOSIS — O4442 Low lying placenta NOS or without hemorrhage, second trimester: Secondary | ICD-10-CM

## 2021-07-12 DIAGNOSIS — O09299 Supervision of pregnancy with other poor reproductive or obstetric history, unspecified trimester: Secondary | ICD-10-CM

## 2021-07-12 DIAGNOSIS — Z3A24 24 weeks gestation of pregnancy: Secondary | ICD-10-CM

## 2021-07-12 DIAGNOSIS — F332 Major depressive disorder, recurrent severe without psychotic features: Secondary | ICD-10-CM

## 2021-07-12 DIAGNOSIS — Z98891 History of uterine scar from previous surgery: Secondary | ICD-10-CM

## 2021-07-12 DIAGNOSIS — O0990 Supervision of high risk pregnancy, unspecified, unspecified trimester: Secondary | ICD-10-CM

## 2021-07-12 DIAGNOSIS — M545 Low back pain, unspecified: Secondary | ICD-10-CM

## 2021-07-12 DIAGNOSIS — O26892 Other specified pregnancy related conditions, second trimester: Secondary | ICD-10-CM

## 2021-07-12 NOTE — Progress Notes (Signed)
   PRENATAL VISIT NOTE  Subjective:  Christie Fowler is a 21 y.o. G2P1001 at [redacted]w[redacted]d being seen today for ongoing prenatal care.  She is currently monitored for the following issues for this high-risk pregnancy and has MDD (major depressive disorder), recurrent severe, without psychosis ([place]); Suicidal ideation; Tylenol ingestion; Supervision of high risk pregnancy, antepartum; Hx of preeclampsia, prior pregnancy, currently pregnant; History of cesarean section; and Low-lying placenta in second trimester on their problem list.  Patient reports backache.  Contractions: Not present. Vag. Bleeding: None.  Movement: Present. Denies leaking of fluid.   The following portions of the patient's history were reviewed and updated as appropriate: allergies, current medications, past family history, past medical history, past social history, past surgical history and problem list.   Objective:   Vitals:   07/12/21 1045  BP: 122/65  Pulse: 97  Weight: 119 lb 6.4 oz (54.2 kg)    Fetal Status: Fetal Heart Rate (bpm): 148 Fundal Height: 24 cm Movement: Present     General:  Alert, oriented and cooperative. Patient is in no acute distress.  Skin: Skin is warm and dry. No rash noted.   Cardiovascular: Normal heart rate noted  Respiratory: Normal respiratory effort, no problems with respiration noted  Abdomen: Soft, gravid, appropriate for gestational age.  Pain/Pressure: Absent     Pelvic: Cervical exam deferred        Extremities: Normal range of motion.  Edema: Trace  Mental Status: Normal mood and affect. Normal behavior. Normal judgment and thought content.   Assessment and Plan:  Pregnancy: G2P1001 at [redacted]w[redacted]d 1. Supervision of high risk pregnancy, antepartum - Anticipatory guidance for GTT, CBC, HIV, RPR and TDAP at next visit discussed  - Plan to sign TOLAC form at next visit  2. Low-lying placenta in second trimester - Repeat US scheduled 7/17 with MFM   3. Hx of preeclampsia, prior pregnancy,  currently pregnant - normotensive today, will continue to monitor   4. History of cesarean section - Desires TOLAC  5. MDD (major depressive disorder), recurrent severe, without psychosis ([place])  6. Low back pain during pregnancy in second trimester - Advised maternity belt may help - Will pick up Rx Flexeril from MAU visit recently  - Heat and ice recommended  - Discussed hydrotherapy  - Work restriction note given   7. [redacted] weeks gestation of pregnancy  Preterm labor symptoms and general obstetric precautions including but not limited to vaginal bleeding, contractions, leaking of fluid and fetal movement were reviewed in detail with the patient. Please refer to After Visit Summary for other counseling recommendations.   Return in about 4 weeks (around 08/09/2021) for 28 week labs (fasting), HOB MD only, In-Person.  Future Appointments  Date Time Provider [place]  07/25/2021 11:00 AM Gillermo Murdoch [facility] None  08/21/2021 10:45 AM [facility] NURSE [facility]  08/21/2021 11:00 AM [facility] US1 [facility]  09/28/2021 11:00 AM Salley Slaughter, NP [facility] None    Kerry Hough, PA-C

## 2021-07-12 NOTE — Progress Notes (Signed)
ROB 24.1 wks  Recent visit to MAU left side low back/ sciatic nerve pain. Has not picked up tylenol or flexeril RX. Advised of where RXs sent.  Has not started RX ASA. Advised of where RX sent and of importance of starting.

## 2021-07-25 ENCOUNTER — Telehealth (HOSPITAL_COMMUNITY): Payer: Self-pay | Admitting: Licensed Clinical Social Worker

## 2021-07-25 ENCOUNTER — Encounter (HOSPITAL_COMMUNITY): Payer: Self-pay

## 2021-07-25 ENCOUNTER — Ambulatory Visit (HOSPITAL_COMMUNITY): Payer: Medicaid Other | Admitting: Licensed Clinical Social Worker

## 2021-07-25 NOTE — Telephone Encounter (Signed)
The therapist attempts to reach Thia by phone when she does not arrive in the virtual waiting room within approximately 15 minutes of the start of the appointment after the therapist already sent the links for the visit via text and email.  The therapist is unable to leave a HIPAA-compliant voicemail as Manha' voicemail is full so the therapist will send a no show letter via mail.   Myrna Blazer, MA, LCSW, [facility], LCAS 07/25/2021

## 2021-08-01 ENCOUNTER — Telehealth (HOSPITAL_COMMUNITY): Payer: Medicaid Other | Admitting: Psychiatry

## 2021-08-05 DIAGNOSIS — Z419 Encounter for procedure for purposes other than remedying health state, unspecified: Secondary | ICD-10-CM | POA: Diagnosis not present

## 2021-08-09 ENCOUNTER — Other Ambulatory Visit: Payer: Medicaid Other

## 2021-08-09 ENCOUNTER — Ambulatory Visit (INDEPENDENT_AMBULATORY_CARE_PROVIDER_SITE_OTHER): Payer: Medicaid Other | Admitting: Obstetrics and Gynecology

## 2021-08-09 VITALS — BP 120/76 | HR 73 | Wt 119.8 lb

## 2021-08-09 DIAGNOSIS — O0990 Supervision of high risk pregnancy, unspecified, unspecified trimester: Secondary | ICD-10-CM | POA: Diagnosis not present

## 2021-08-09 DIAGNOSIS — Z3A28 28 weeks gestation of pregnancy: Secondary | ICD-10-CM

## 2021-08-09 DIAGNOSIS — O4442 Low lying placenta NOS or without hemorrhage, second trimester: Secondary | ICD-10-CM

## 2021-08-09 DIAGNOSIS — Z98891 History of uterine scar from previous surgery: Secondary | ICD-10-CM

## 2021-08-09 DIAGNOSIS — O09299 Supervision of pregnancy with other poor reproductive or obstetric history, unspecified trimester: Secondary | ICD-10-CM

## 2021-08-09 NOTE — Progress Notes (Signed)
PRENATAL VISIT NOTE  Subjective:  Christie Fowler is a 21 y.o. G2P1001 at [redacted]w[redacted]d being seen today for ongoing prenatal care.  She is currently monitored for the following issues for this high-risk pregnancy and has MDD (major depressive disorder), recurrent severe, without psychosis ([facility]); Suicidal ideation; Tylenol ingestion; Supervision of high risk pregnancy, antepartum; Hx of preeclampsia, prior pregnancy, currently pregnant; History of cesarean section; and Low-lying placenta in second trimester on their problem list.  Patient doing well with no acute concerns today. She reports no complaints.  Contractions: Not present. Vag. Bleeding: None.  Movement: Present. Denies leaking of fluid.   The following portions of the patient's history were reviewed and updated as appropriate: allergies, current medications, past family history, past medical history, past social history, past surgical history and problem list. Problem list updated.   Discussed VBAC decision:   21 y.o. G2P1001 at [redacted]w[redacted]d with Estimated Date of Delivery: 10/31/21 was seen today in office to discuss trial of labor after cesarean section (TOLAC) versus elective repeat cesarean delivery (ERCD). The following risks were discussed with the patient.  Risk of uterine rupture at term is 0.78 percent with TOLAC and 0.22 percent with ERCD. 1 in 10 uterine ruptures will result in neonatal death or neurological injury. The benefits of a trial of labor after cesarean (TOLAC) resulting in a vaginal birth after cesarean (VBAC) include the following: shorter length of hospital stay and postpartum recovery (in most cases); fewer complications, such as postpartum fever, wound or uterine infection, thromboembolism (blood clots in the leg or lung), need for blood transfusion and fewer neonatal breathing problems. The risks of an attempted VBAC or TOLAC include the following: Risk of failed trial of labor after cesarean (TOLAC) without a vaginal birth  after cesarean (VBAC) resulting in repeat cesarean delivery (RCD) in about 20 to 40 percent of women who attempt VBAC.  Risk of rupture of uterus resulting in an emergency cesarean delivery. The risk of uterine rupture may be related in part to the type of uterine incision made during the first cesarean delivery. A previous transverse uterine incision has the lowest risk of rupture (0.2 to 1.5 percent risk). Vertical or T-shaped uterine incisions have a higher risk of uterine rupture (4 to 9 percent risk)The risk of fetal death is very low with both VBAC and elective repeat cesarean delivery (ERCD), but the likelihood of fetal death is higher with VBAC than with ERCD. Maternal death is very rare with either type of delivery. The risks of an elective repeat cesarean delivery (ERCD) were reviewed with the patient including but not limited to: 03/998 risk of uterine rupture which could have serious consequences, bleeding which may require transfusion; infection which may require antibiotics; injury to bowel, bladder or other surrounding organs (bowel, bladder, ureters); injury to the fetus; need for additional procedures including hysterectomy in the event of a life-threatening hemorrhage; thromboembolic phenomenon; abnormal placentation; incisional problems; death and other postoperative or anesthesia complications.    These risks and benefits are summarized on the consent form, which was reviewed with the patient during the visit.  All her questions answered and she signed a consent indicating a preference for TOLAC/ERCD. A copy of the consent was given to the patient.      Objective:   Vitals:   08/09/21 0853  BP: 120/76  Pulse: 73  Weight: 119 lb 12.8 oz (54.3 kg)    Fetal Status: Fetal Heart Rate (bpm): 145 Fundal Height: 27 cm Movement: Present  General:  Alert, oriented and cooperative. Patient is in no acute distress.  Skin: Skin is warm and dry. No rash noted.   Cardiovascular: Normal  heart rate noted  Respiratory: Normal respiratory effort, no problems with respiration noted  Abdomen: Soft, gravid, appropriate for gestational age.  Pain/Pressure: Absent     Pelvic: Cervical exam deferred        Extremities: Normal range of motion.  Edema: Trace  Mental Status:  Normal mood and affect. Normal behavior. Normal judgment and thought content.   Assessment and Plan:  Pregnancy: G2P1001 at [redacted]w[redacted]d  1. [redacted] weeks gestation of pregnancy   2. Supervision of high risk pregnancy, antepartum Continue routine prenatal care - Glucose Tolerance, 2 Hours w/1 Hour - RPR - CBC - HIV antibody (with reflex)  3. Hx of preeclampsia, prior pregnancy, currently pregnant Discussed baby ASA with the patient, she was unaware she could pick it up OTC, will try to get it today  4. History of cesarean section Pt desires TOLAC, consent signed and on the chart  5. Low-lying placenta in second trimester Reassess at next u/s 08/21/21  Preterm labor symptoms and general obstetric precautions including but not limited to vaginal bleeding, contractions, leaking of fluid and fetal movement were reviewed in detail with the patient.  Please refer to After Visit Summary for other counseling recommendations.   Return in about 2 weeks (around 08/23/2021) for [facility], in person.   Mariel Aloe, MD Faculty Attending Center for [facility]

## 2021-08-09 NOTE — Progress Notes (Signed)
Pt reports fetal movement, denies pain. Pt will get tdap at next visit.

## 2021-08-10 LAB — CBC
Hematocrit: 30.8 % — ABNORMAL LOW (ref 34.0–46.6)
Hemoglobin: 10.1 g/dL — ABNORMAL LOW (ref 11.1–15.9)
MCH: 30.8 pg (ref 26.6–33.0)
MCHC: 32.8 g/dL (ref 31.5–35.7)
MCV: 94 fL (ref 79–97)
Platelets: 144 10*3/uL — ABNORMAL LOW (ref 150–450)
RBC: 3.28 x10E6/uL — ABNORMAL LOW (ref 3.77–5.28)
RDW: 12.3 % (ref 11.7–15.4)
WBC: 6.3 10*3/uL (ref 3.4–10.8)

## 2021-08-10 LAB — GLUCOSE TOLERANCE, 2 HOURS W/ 1HR
Glucose, 1 hour: 101 mg/dL (ref 70–179)
Glucose, 2 hour: 88 mg/dL (ref 70–152)
Glucose, Fasting: 75 mg/dL (ref 70–91)

## 2021-08-10 LAB — HIV ANTIBODY (ROUTINE TESTING W REFLEX): HIV Screen 4th Generation wRfx: NONREACTIVE

## 2021-08-10 LAB — RPR: RPR Ser Ql: NONREACTIVE

## 2021-08-21 ENCOUNTER — Encounter: Payer: Self-pay | Admitting: *Deleted

## 2021-08-21 ENCOUNTER — Other Ambulatory Visit: Payer: Self-pay | Admitting: *Deleted

## 2021-08-21 ENCOUNTER — Ambulatory Visit: Payer: Medicaid Other | Admitting: *Deleted

## 2021-08-21 ENCOUNTER — Ambulatory Visit: Payer: Medicaid Other | Attending: Obstetrics

## 2021-08-21 VITALS — BP 106/58 | HR 69

## 2021-08-21 DIAGNOSIS — O34219 Maternal care for unspecified type scar from previous cesarean delivery: Secondary | ICD-10-CM | POA: Diagnosis not present

## 2021-08-21 DIAGNOSIS — Z3A29 29 weeks gestation of pregnancy: Secondary | ICD-10-CM | POA: Insufficient documentation

## 2021-08-21 DIAGNOSIS — O365930 Maternal care for other known or suspected poor fetal growth, third trimester, not applicable or unspecified: Secondary | ICD-10-CM | POA: Insufficient documentation

## 2021-08-21 DIAGNOSIS — O09293 Supervision of pregnancy with other poor reproductive or obstetric history, third trimester: Secondary | ICD-10-CM | POA: Insufficient documentation

## 2021-08-21 DIAGNOSIS — Z3201 Encounter for pregnancy test, result positive: Secondary | ICD-10-CM | POA: Insufficient documentation

## 2021-08-21 DIAGNOSIS — O0990 Supervision of high risk pregnancy, unspecified, unspecified trimester: Secondary | ICD-10-CM

## 2021-08-21 DIAGNOSIS — O4442 Low lying placenta NOS or without hemorrhage, second trimester: Secondary | ICD-10-CM

## 2021-08-21 DIAGNOSIS — Z8759 Personal history of other complications of pregnancy, childbirth and the puerperium: Secondary | ICD-10-CM

## 2021-08-21 DIAGNOSIS — Z3491 Encounter for supervision of normal pregnancy, unspecified, first trimester: Secondary | ICD-10-CM | POA: Insufficient documentation

## 2021-08-28 ENCOUNTER — Encounter: Payer: Self-pay | Admitting: *Deleted

## 2021-08-28 ENCOUNTER — Ambulatory Visit: Payer: Medicaid Other | Attending: Obstetrics

## 2021-08-28 ENCOUNTER — Ambulatory Visit: Payer: Medicaid Other | Admitting: *Deleted

## 2021-08-28 ENCOUNTER — Ambulatory Visit (HOSPITAL_BASED_OUTPATIENT_CLINIC_OR_DEPARTMENT_OTHER): Payer: Medicaid Other | Admitting: *Deleted

## 2021-08-28 VITALS — BP 111/58 | HR 82

## 2021-08-28 DIAGNOSIS — Z3A30 30 weeks gestation of pregnancy: Secondary | ICD-10-CM | POA: Insufficient documentation

## 2021-08-28 DIAGNOSIS — O09293 Supervision of pregnancy with other poor reproductive or obstetric history, third trimester: Secondary | ICD-10-CM

## 2021-08-28 DIAGNOSIS — O365930 Maternal care for other known or suspected poor fetal growth, third trimester, not applicable or unspecified: Secondary | ICD-10-CM | POA: Insufficient documentation

## 2021-08-28 DIAGNOSIS — O4443 Low lying placenta NOS or without hemorrhage, third trimester: Secondary | ICD-10-CM

## 2021-08-28 DIAGNOSIS — O4442 Low lying placenta NOS or without hemorrhage, second trimester: Secondary | ICD-10-CM | POA: Insufficient documentation

## 2021-08-28 DIAGNOSIS — Z8759 Personal history of other complications of pregnancy, childbirth and the puerperium: Secondary | ICD-10-CM | POA: Diagnosis not present

## 2021-08-28 DIAGNOSIS — O0990 Supervision of high risk pregnancy, unspecified, unspecified trimester: Secondary | ICD-10-CM | POA: Insufficient documentation

## 2021-08-28 DIAGNOSIS — O34219 Maternal care for unspecified type scar from previous cesarean delivery: Secondary | ICD-10-CM

## 2021-08-28 NOTE — Procedures (Signed)
Christie Fowler 18-Apr-2000 [redacted]w[redacted]d  Fetus A Non-Stress Test Interpretation for 08/28/21  Indication: IUGR  Fetal Heart Rate A Mode: External Baseline Rate (A): 145 bpm Variability: Moderate Accelerations: 15 x 15 Decelerations: None Multiple birth?: No  Uterine Activity Mode: Palpation, Toco Contraction Frequency (min): ui Resting Tone Palpated: Relaxed  Interpretation (Fetal Testing) Nonstress Test Interpretation: Reactive Overall Impression: Reassuring for gestational age Comments: Dr. Judeth Cornfield reviewed tracing

## 2021-08-30 ENCOUNTER — Encounter: Payer: Medicaid Other | Admitting: Obstetrics and Gynecology

## 2021-08-31 ENCOUNTER — Encounter (HOSPITAL_COMMUNITY): Payer: Self-pay | Admitting: Obstetrics and Gynecology

## 2021-08-31 ENCOUNTER — Inpatient Hospital Stay (HOSPITAL_COMMUNITY)
Admission: AD | Admit: 2021-08-31 | Discharge: 2021-08-31 | Disposition: A | Discharge status: Home / Self Care | Payer: Medicaid Other | Attending: Obstetrics and Gynecology | Admitting: Obstetrics and Gynecology

## 2021-08-31 DIAGNOSIS — O0990 Supervision of high risk pregnancy, unspecified, unspecified trimester: Secondary | ICD-10-CM

## 2021-08-31 DIAGNOSIS — O99353 Diseases of the nervous system complicating pregnancy, third trimester: Secondary | ICD-10-CM | POA: Diagnosis not present

## 2021-08-31 DIAGNOSIS — O4442 Low lying placenta NOS or without hemorrhage, second trimester: Secondary | ICD-10-CM

## 2021-08-31 DIAGNOSIS — G5600 Carpal tunnel syndrome, unspecified upper limb: Secondary | ICD-10-CM | POA: Insufficient documentation

## 2021-08-31 DIAGNOSIS — Z3A31 31 weeks gestation of pregnancy: Secondary | ICD-10-CM | POA: Insufficient documentation

## 2021-08-31 DIAGNOSIS — O26899 Other specified pregnancy related conditions, unspecified trimester: Secondary | ICD-10-CM

## 2021-08-31 DIAGNOSIS — R102 Pelvic and perineal pain: Secondary | ICD-10-CM | POA: Diagnosis not present

## 2021-08-31 DIAGNOSIS — O26893 Other specified pregnancy related conditions, third trimester: Secondary | ICD-10-CM | POA: Insufficient documentation

## 2021-08-31 LAB — URINALYSIS, ROUTINE W REFLEX MICROSCOPIC
Bilirubin Urine: NEGATIVE
Glucose, UA: NEGATIVE mg/dL
Hgb urine dipstick: NEGATIVE
Ketones, ur: NEGATIVE mg/dL
Leukocytes,Ua: NEGATIVE
Nitrite: NEGATIVE
Protein, ur: NEGATIVE mg/dL
Specific Gravity, Urine: 1.01 (ref 1.005–1.030)
pH: 8 (ref 5.0–8.0)

## 2021-08-31 NOTE — MAU Provider Note (Addendum)
History     CSN: [id number]  Arrival date and time: 08/31/21 1211   Event Date/Time   First Provider Initiated Contact with Patient 08/31/21 1315      No chief complaint on file.  Patient is a 21 year old at 31 weeks 2 days presenting for pelvic pain as well as numbness in the first 3 digits of her left hand.  She reports the pelvic pain happened approximately 1-1/2 hours ago when she noticed 3 episodes of severe pelvic pain and a feeling like she had to poop.  She was concerned that this may be contractions and wanted to be evaluated.  She does report she has been feeling considerable pressure in her pelvis as her pregnancy has progressed and it improves with laying down and gets worse with standing for extended periods of time.  She has been recommended to get a pregnancy belt but has not yet done this.  Patient reports that she has had issues with constipation recently and that she usually drinks plenty of water but over the last week the amount of water she has been drinking is decreased.  She reports that she had a bowel movement yesterday which was extremely hard and difficult to pass.  She also reports she has noticed occasional episodes of numbness in the first 3 digits of her left hand.  Reports that the feeling comes and goes and sometimes it feels like electricity shooting into the fingers.  She is not experiencing it right now.  Denies any weakness in her hands.    OB History     Gravida  2   Para  1   Term  1   Preterm      AB      Living  1      SAB      IAB      Ectopic      Multiple  0   Live Births  1           Past Medical History:  Diagnosis Date   Hypertension     Past Surgical History:  Procedure Laterality Date   CESAREAN SECTION N/A 02/01/2020   Procedure: CESAREAN SECTION;  Surgeon: Lavina Hamman, MD;  Location: [facility] LD [facility]S;  Service: Obstetrics;  Laterality: N/A;   NO PAST SURGERIES      Family History  Problem Relation Age of  Onset   Hypertension Father    Diabetes Paternal Grandmother     Social History   Tobacco Use   Smoking status: Never   Smokeless tobacco: Never  Substance Use Topics   Alcohol use: Never   Drug use: Never    Allergies: No Known Allergies  Medications Prior to Admission  Medication Sig Dispense Refill Last Dose   aspirin EC 81 MG tablet Take 1 tablet (81 mg total) by mouth daily. Swallow whole. 30 tablet 11 08/31/2021   Prenatal Vit-Fe Fumarate-FA (PRENATAL MULTIVITAMIN) TABS tablet Take 1 tablet by mouth daily at 12 noon.   08/31/2021   acetaminophen (TYLENOL) 500 MG tablet Take 2 tablets (1,000 mg total) by mouth every 8 (eight) hours. (Patient not taking: Reported on 07/12/2021) 60 tablet 1    cyclobenzaprine (FLEXERIL) 5 MG tablet Take 1 tablet (5 mg total) by mouth 3 (three) times daily as needed for muscle spasms. (Patient not taking: Reported on 07/12/2021) 15 tablet 0    Doxylamine-Pyridoxine (DICLEGIS) 10-10 MG TBEC Take 2 tablets by mouth daily. The dose may be increased to four tablets orally  over the course of the day, as needed, for more severe nausea (two tablets at bedtime and add one tablet midmorning and one tablet in midafternoon if needed) (Patient not taking: Reported on 06/01/2021) 60 tablet 0    nitrofurantoin, macrocrystal-monohydrate, (MACROBID) 100 MG capsule Take 1 capsule (100 mg total) by mouth 2 (two) times daily. (Patient not taking: Reported on 07/12/2021) 14 capsule 0    sertraline (ZOLOFT) 25 MG tablet Take 1 tablet (25 mg total) by mouth daily. (Patient not taking: Reported on 08/09/2021) 30 tablet 3     Review of Systems  Constitutional:  Negative for appetite change and fever.  HENT:  Negative for congestion and rhinorrhea.   Respiratory:  Negative for chest tightness and shortness of breath.   Gastrointestinal:  Positive for abdominal pain and constipation. Negative for nausea and vomiting.  Endocrine: Negative for polyuria.  Genitourinary:  Negative for  dysuria, vaginal bleeding, vaginal discharge and vaginal pain.  Neurological:  Positive for numbness (in first 3 digits of left hand). Negative for headaches.   Physical Exam   Blood pressure (!) 107/57, pulse 75, temperature (!) 97.5 F (36.4 C), temperature source Oral, resp. rate 15, height 5\' 3"  (1.6 m), weight 54 kg, last menstrual period 01/24/2021, SpO2 100 %, unknown if currently breastfeeding.  Physical Exam Constitutional:      Appearance: Normal appearance.  HENT:     Head: Normocephalic and atraumatic.     Right Ear: External ear normal.     Left Ear: External ear normal.     Nose: Nose normal.  Eyes:     Extraocular Movements: Extraocular movements intact.  Cardiovascular:     Rate and Rhythm: Normal rate and regular rhythm.     Pulses: Normal pulses.  Pulmonary:     Effort: Pulmonary effort is normal.  Abdominal:     Tenderness: There is no abdominal tenderness.     Comments: Gravid    Musculoskeletal:        General: Normal range of motion.     Cervical back: Normal range of motion.  Skin:    General: Skin is warm.     Capillary Refill: Capillary refill takes less than 2 seconds.  Neurological:     General: No focal deficit present.     Mental Status: She is alert.     Sensory: No sensory deficit.     Motor: No weakness.    FHT 135 bpm moderate variability  No contractions appreciated   MAU Course  Procedures Lab orders   Urinalysis  MDM Exam was reassuring.  No longer experiencing the pain or pelvic pressure while laying down - Feel that earlier pain may be related to constipation or could be 01/26/2021 contractions.  No contractions appreciated on tocometer - Fetal wellbeing reassuring - Urinalysis within normal limits  Assessment and Plan  Pelvic pressure in pregnancy Pelvic pressure differential includes Braxton Hicks contractions versus constipation versus early labor.  No contractions appreciated on tocometer today.  Likely related to  constipation.  Discussed adequate hydration along with use of over-the-counter medications. Discussed that the constant pressure she feels when she is on her feet for long periods of time may be relieved with a pregnancy belt.  She states that she is going to get one when she leaves here. Discussed labor precautions and strict return precautions and patient is agreeable to this. Carpal tunnel syndrome affecting pregnancy Not currently bothering the patient but discussed treatment options if it continues to worsen.  This may include nightly wrist splint.  Patient will watch and be further evaluated or get a wrist splint if needed [redacted] weeks gestation pregnancy Patient missed scheduled OB visit yesterday.  She reports that she is going to call and schedule her next OB visit today. Fetal heart tracing category 1   Patient discharged home in stable condition  Celedonio Savage 08/31/2021, 1:17 PM

## 2021-08-31 NOTE — MAU Note (Addendum)
...  Christie Fowler is a 22 y.o. at [redacted]w[redacted]d here in MAU reporting: One hour ago she began feeling tightness in her abdomen that feel like potential contractions. She reports it has happened three times in the last hour and each episode of tightness lasted five minutes each. She reports she hasn't felt the tightness in the last thirty minutes. She is also endorsing sharp shooting pains in her anterior pelvis for the past week. She reports her pelvic pain increases in severity with urinating and sitting on the toilet. She reports applying pressure to her pelvic region relieves some of her pain. Endorses urinary frequency. She reports she urinates around 3 times in an hour and this began five days ago. SOB for the past two weeks with walking. Denies VB, LOF, vaginal itching, and vaginal discharge. +FM.   She reports she is also concerned about her left hand as she has been experiencing intermittent numbness of her thumb, index, and middle fingers since this past Tuesday. She reports her hands have felt swollen.  Onset of complaint: Today  Pain score:  7/10 abdomen 8/10 pelvis   FHT: 150 doppler Lab orders placed from triage: UA

## 2021-08-31 NOTE — Discharge Instructions (Signed)
It was wonderful taking care of you today.  I am pleased that your pelvic pressure is likely related to your baby pressing on your bladder as well as constipation.  I want you to make sure to drink plenty of water and there are some over-the-counter medications you can take to help with constipation.  I have attached a list of medications that are safe during pregnancy.  I do recommend you get a pregnancy belt to help with support.  Regarding the numbness in your fingers I believe that is related to carpal tunnel.  You can wear a wrist brace at night which may help with the symptoms.  If you have any questions or concerns please follow-up with your OB doctor.  I hope you have a wonderful afternoon!  Safe Medications in Pregnancy   Acne:  Benzoyl Peroxide  Salicylic Acid   Backache/Headache:  Tylenol: 2 regular strength every 4 hours OR               2 Extra strength every 6 hours   Colds/Coughs/Allergies:  Benadryl (alcohol free) 25 mg every 6 hours as needed  Breath right strips  Claritin  Cepacol throat lozenges  Chloraseptic throat spray  Cold-Eeze- up to three times per day  Cough drops, alcohol free  Flonase (by prescription only)  Guaifenesin  Mucinex  Robitussin DM (plain only, alcohol free)  Saline nasal spray/drops  Sudafed (pseudoephedrine) & Actifed * use only after [redacted] weeks gestation and if you do not have high blood pressure  Tylenol  Vicks Vaporub  Zinc lozenges  Zyrtec   Constipation:  Colace  Ducolax suppositories  Fleet enema  Glycerin suppositories  Metamucil  Milk of magnesia  Miralax  Senokot  Smooth move tea   Diarrhea:  Kaopectate  Imodium A-D   *NO pepto Bismol   Hemorrhoids:  Anusol  Anusol HC  Preparation H  Tucks   Indigestion:  Tums  Maalox  Mylanta  Zantac  Pepcid   Insomnia:  Benadryl (alcohol free) 25mg  every 6 hours as needed  Tylenol PM  Unisom, no Gelcaps   Leg Cramps:  Tums  MagGel   Nausea/Vomiting:  Bonine   Dramamine  Emetrol  Ginger extract  Sea bands  Meclizine  Nausea medication to take during pregnancy:  Unisom (doxylamine succinate 25 mg tablets) Take one tablet daily at bedtime. If symptoms are not adequately controlled, the dose can be increased to a maximum recommended dose of two tablets daily (1/2 tablet in the morning, 1/2 tablet mid-afternoon and one at bedtime).  Vitamin B6 100mg  tablets. Take one tablet twice a day (up to 200 mg per day).   Skin Rashes:  Aveeno products  Benadryl cream or 25mg  every 6 hours as needed  Calamine Lotion  1% cortisone cream   Yeast infection:  Gyne-lotrimin 7  Monistat 7    **If taking multiple medications, please check labels to avoid duplicating the same active ingredients  **take medication as directed on the label  ** Do not exceed 4000 mg of tylenol in 24 hours  **Do not take medications that contain aspirin or ibuprofen

## 2021-09-04 ENCOUNTER — Encounter: Payer: Self-pay | Admitting: *Deleted

## 2021-09-04 ENCOUNTER — Ambulatory Visit: Payer: Medicaid Other | Attending: Obstetrics

## 2021-09-04 ENCOUNTER — Other Ambulatory Visit: Payer: Self-pay | Admitting: *Deleted

## 2021-09-04 ENCOUNTER — Ambulatory Visit: Payer: Medicaid Other | Admitting: *Deleted

## 2021-09-04 VITALS — BP 107/55 | HR 70

## 2021-09-04 DIAGNOSIS — O34219 Maternal care for unspecified type scar from previous cesarean delivery: Secondary | ICD-10-CM

## 2021-09-04 DIAGNOSIS — O365930 Maternal care for other known or suspected poor fetal growth, third trimester, not applicable or unspecified: Secondary | ICD-10-CM

## 2021-09-04 DIAGNOSIS — O4442 Low lying placenta NOS or without hemorrhage, second trimester: Secondary | ICD-10-CM | POA: Insufficient documentation

## 2021-09-04 DIAGNOSIS — O09293 Supervision of pregnancy with other poor reproductive or obstetric history, third trimester: Secondary | ICD-10-CM

## 2021-09-04 DIAGNOSIS — Z8759 Personal history of other complications of pregnancy, childbirth and the puerperium: Secondary | ICD-10-CM | POA: Diagnosis not present

## 2021-09-04 DIAGNOSIS — O0990 Supervision of high risk pregnancy, unspecified, unspecified trimester: Secondary | ICD-10-CM | POA: Insufficient documentation

## 2021-09-04 DIAGNOSIS — Z3A31 31 weeks gestation of pregnancy: Secondary | ICD-10-CM | POA: Diagnosis not present

## 2021-09-04 DIAGNOSIS — O4443 Low lying placenta NOS or without hemorrhage, third trimester: Secondary | ICD-10-CM | POA: Diagnosis not present

## 2021-09-05 DIAGNOSIS — Z419 Encounter for procedure for purposes other than remedying health state, unspecified: Secondary | ICD-10-CM | POA: Diagnosis not present

## 2021-09-11 ENCOUNTER — Ambulatory Visit: Payer: Medicaid Other | Admitting: *Deleted

## 2021-09-11 ENCOUNTER — Ambulatory Visit: Payer: Medicaid Other | Attending: Obstetrics

## 2021-09-11 VITALS — BP 110/51 | HR 72

## 2021-09-11 DIAGNOSIS — O0990 Supervision of high risk pregnancy, unspecified, unspecified trimester: Secondary | ICD-10-CM | POA: Diagnosis not present

## 2021-09-11 DIAGNOSIS — O4442 Low lying placenta NOS or without hemorrhage, second trimester: Secondary | ICD-10-CM | POA: Insufficient documentation

## 2021-09-11 DIAGNOSIS — O365930 Maternal care for other known or suspected poor fetal growth, third trimester, not applicable or unspecified: Secondary | ICD-10-CM | POA: Diagnosis not present

## 2021-09-11 DIAGNOSIS — Z3A32 32 weeks gestation of pregnancy: Secondary | ICD-10-CM

## 2021-09-11 DIAGNOSIS — O34219 Maternal care for unspecified type scar from previous cesarean delivery: Secondary | ICD-10-CM | POA: Diagnosis not present

## 2021-09-11 DIAGNOSIS — O09293 Supervision of pregnancy with other poor reproductive or obstetric history, third trimester: Secondary | ICD-10-CM | POA: Diagnosis not present

## 2021-09-11 DIAGNOSIS — O4443 Low lying placenta NOS or without hemorrhage, third trimester: Secondary | ICD-10-CM | POA: Diagnosis not present

## 2021-09-11 DIAGNOSIS — Z8759 Personal history of other complications of pregnancy, childbirth and the puerperium: Secondary | ICD-10-CM | POA: Diagnosis not present

## 2021-09-18 ENCOUNTER — Encounter (HOSPITAL_COMMUNITY): Payer: Self-pay | Admitting: Obstetrics & Gynecology

## 2021-09-18 ENCOUNTER — Ambulatory Visit: Payer: Medicaid Other | Admitting: *Deleted

## 2021-09-18 ENCOUNTER — Other Ambulatory Visit: Payer: Self-pay | Admitting: Obstetrics

## 2021-09-18 ENCOUNTER — Ambulatory Visit: Payer: Medicaid Other | Attending: Obstetrics

## 2021-09-18 ENCOUNTER — Inpatient Hospital Stay (HOSPITAL_COMMUNITY)
Admission: AD | Admit: 2021-09-18 | Discharge: 2021-09-18 | Disposition: A | Discharge status: Home / Self Care | Payer: Medicaid Other | Attending: Obstetrics and Gynecology | Admitting: Obstetrics and Gynecology

## 2021-09-18 VITALS — BP 113/67 | HR 81

## 2021-09-18 DIAGNOSIS — O4443 Low lying placenta NOS or without hemorrhage, third trimester: Secondary | ICD-10-CM | POA: Diagnosis not present

## 2021-09-18 DIAGNOSIS — O99891 Other specified diseases and conditions complicating pregnancy: Secondary | ICD-10-CM | POA: Diagnosis not present

## 2021-09-18 DIAGNOSIS — Z3A33 33 weeks gestation of pregnancy: Secondary | ICD-10-CM | POA: Diagnosis not present

## 2021-09-18 DIAGNOSIS — O99013 Anemia complicating pregnancy, third trimester: Secondary | ICD-10-CM | POA: Diagnosis present

## 2021-09-18 DIAGNOSIS — O09293 Supervision of pregnancy with other poor reproductive or obstetric history, third trimester: Secondary | ICD-10-CM

## 2021-09-18 DIAGNOSIS — O4442 Low lying placenta NOS or without hemorrhage, second trimester: Secondary | ICD-10-CM | POA: Diagnosis not present

## 2021-09-18 DIAGNOSIS — Z3689 Encounter for other specified antenatal screening: Secondary | ICD-10-CM

## 2021-09-18 DIAGNOSIS — D696 Thrombocytopenia, unspecified: Secondary | ICD-10-CM | POA: Diagnosis not present

## 2021-09-18 DIAGNOSIS — O26893 Other specified pregnancy related conditions, third trimester: Secondary | ICD-10-CM | POA: Insufficient documentation

## 2021-09-18 DIAGNOSIS — O212 Late vomiting of pregnancy: Secondary | ICD-10-CM | POA: Insufficient documentation

## 2021-09-18 DIAGNOSIS — O99119 Other diseases of the blood and blood-forming organs and certain disorders involving the immune mechanism complicating pregnancy, unspecified trimester: Secondary | ICD-10-CM | POA: Diagnosis present

## 2021-09-18 DIAGNOSIS — Z8759 Personal history of other complications of pregnancy, childbirth and the puerperium: Secondary | ICD-10-CM

## 2021-09-18 DIAGNOSIS — O99113 Other diseases of the blood and blood-forming organs and certain disorders involving the immune mechanism complicating pregnancy, third trimester: Secondary | ICD-10-CM | POA: Diagnosis not present

## 2021-09-18 DIAGNOSIS — O365930 Maternal care for other known or suspected poor fetal growth, third trimester, not applicable or unspecified: Secondary | ICD-10-CM

## 2021-09-18 DIAGNOSIS — O0990 Supervision of high risk pregnancy, unspecified, unspecified trimester: Secondary | ICD-10-CM | POA: Insufficient documentation

## 2021-09-18 DIAGNOSIS — M549 Dorsalgia, unspecified: Secondary | ICD-10-CM | POA: Diagnosis present

## 2021-09-18 DIAGNOSIS — O283 Abnormal ultrasonic finding on antenatal screening of mother: Secondary | ICD-10-CM | POA: Insufficient documentation

## 2021-09-18 DIAGNOSIS — O34219 Maternal care for unspecified type scar from previous cesarean delivery: Secondary | ICD-10-CM

## 2021-09-18 LAB — URINALYSIS, ROUTINE W REFLEX MICROSCOPIC
Bilirubin Urine: NEGATIVE
Glucose, UA: NEGATIVE mg/dL
Hgb urine dipstick: NEGATIVE
Ketones, ur: 5 mg/dL — AB
Nitrite: NEGATIVE
Protein, ur: NEGATIVE mg/dL
Specific Gravity, Urine: 1.016 (ref 1.005–1.030)
pH: 7 (ref 5.0–8.0)

## 2021-09-18 LAB — COMPREHENSIVE METABOLIC PANEL
ALT: 13 U/L (ref 0–44)
AST: 17 U/L (ref 15–41)
Albumin: 3 g/dL — ABNORMAL LOW (ref 3.5–5.0)
Alkaline Phosphatase: 127 U/L — ABNORMAL HIGH (ref 38–126)
Anion gap: 8 (ref 5–15)
BUN: <5 mg/dL — ABNORMAL LOW (ref 6–20)
CO2: 22 mmol/L (ref 22–32)
Calcium: 8.9 mg/dL (ref 8.9–10.3)
Chloride: 104 mmol/L (ref 98–111)
Creatinine, Ser: 0.53 mg/dL (ref 0.44–1.00)
GFR, Estimated: >60 mL/min (ref >60)
Glucose, Bld: 76 mg/dL (ref 70–99)
Potassium: 3.6 mmol/L (ref 3.5–5.1)
Sodium: 134 mmol/L — ABNORMAL LOW (ref 135–145)
Total Bilirubin: 1.2 mg/dL (ref 0.3–1.2)
Total Protein: 6.5 g/dL (ref 6.5–8.1)

## 2021-09-18 LAB — CBC WITH DIFFERENTIAL/PLATELET
Abs Immature Granulocytes: 0.02 10*3/uL (ref 0.00–0.07)
Basophils Absolute: 0 10*3/uL (ref 0.0–0.1)
Basophils Relative: 0 %
Eosinophils Absolute: 0 10*3/uL (ref 0.0–0.5)
Eosinophils Relative: 0 %
HCT: 28.4 % — ABNORMAL LOW (ref 36.0–46.0)
Hemoglobin: 9.6 g/dL — ABNORMAL LOW (ref 12.0–15.0)
Immature Granulocytes: 0 %
Lymphocytes Relative: 13 %
Lymphs Abs: 0.9 10*3/uL (ref 0.7–4.0)
MCH: 30.3 pg (ref 26.0–34.0)
MCHC: 33.8 g/dL (ref 30.0–36.0)
MCV: 89.6 fL (ref 80.0–100.0)
Monocytes Absolute: 0.6 10*3/uL (ref 0.1–1.0)
Monocytes Relative: 9 %
Neutro Abs: 5.1 10*3/uL (ref 1.7–7.7)
Neutrophils Relative %: 78 %
Platelets: 104 10*3/uL — ABNORMAL LOW (ref 150–400)
RBC: 3.17 MIL/uL — ABNORMAL LOW (ref 3.87–5.11)
RDW: 12.7 % (ref 11.5–15.5)
WBC: 6.6 10*3/uL (ref 4.0–10.5)
nRBC: 0 % (ref 0.0–0.2)

## 2021-09-18 MED ORDER — ACETAMINOPHEN 500 MG PO TABS
1000.0000 mg | ORAL_TABLET | Freq: Once | ORAL | Status: AC
  Administered 2021-09-18: 1000 mg via ORAL
  Filled 2021-09-18: qty 2

## 2021-09-18 MED ORDER — FERROUS SULFATE 325 (65 FE) MG PO TABS: 325.0000 mg | ORAL_TABLET | ORAL | 3 refills | Status: DC

## 2021-09-18 MED ORDER — CYCLOBENZAPRINE HCL 5 MG PO TABS
5.0000 mg | ORAL_TABLET | Freq: Once | ORAL | Status: AC
  Administered 2021-09-18: 5 mg via ORAL
  Filled 2021-09-18: qty 1

## 2021-09-18 NOTE — MAU Provider Note (Signed)
History     CSN: [id number]  Arrival date and time: 09/18/21 1636   Event Date/Time   First Provider Initiated Contact with Patient 09/18/21 1829      Chief Complaint  Patient presents with   Back Pain   Emesis   Fatigue   Generalized Body Aches   Ms. Lavergne Leavy CellaBoyd is a 21 y.o. G2P1001 at 2436w6d who presents to MAU for pain. Patient states yesterday she was having back pain and it made her very tired. Patient states today she woke up and the back pain was worse and spread throughout her body and she was also experiencing more fatigue. A few hours ago patient threw up once, had unbearable pain, but now is feeling better. Patient reports she is having back pain currently that she rates as 8/10 and a headache that she rates as 4/10. Patient states she took Flexiril yesterday and went right to sleep, but today she tried to take it around 330pm, but threw it up about 15minutes after taking and saw the pill come up. Patient states no nausea or vomiting at this time. Patient identifies area of pain as low back and states it is bilateral.  Pt denies VB, LOF, ctx, decreased FM, vaginal discharge/odor/itching. Pt denies N/V, abdominal pain, constipation, diarrhea, or urinary problems. Pt denies fever, chills, fatigue, sweating or changes in appetite. Pt denies SOB or chest pain. Pt denies dizziness, light-headedness, weakness.  Problems this pregnancy include: none. Allergies? NKDA Current medications/supplements? Low dose ASA, PNV Prenatal care provider? [facility], next appt 09/27/2021    OB History     Gravida  2   Para  1   Term  1   Preterm      AB      Living  1      SAB      IAB      Ectopic      Multiple  0   Live Births  1           Past Medical History:  Diagnosis Date   Hypertension     Past Surgical History:  Procedure Laterality Date   CESAREAN SECTION N/A 02/01/2020   Procedure: CESAREAN SECTION;  Surgeon: Lavina HammanMeisinger, Todd, MD;  Location: [facility];  Service: Obstetrics;  Laterality: N/A;   NO PAST SURGERIES      Family History  Problem Relation Age of Onset   Hypertension Father    Diabetes Paternal Grandmother     Social History   Tobacco Use   Smoking status: Never   Smokeless tobacco: Never  Vaping Use   Vaping Use: Never used  Substance Use Topics   Alcohol use: Never   Drug use: Never    Allergies: No Known Allergies  Medications Prior to Admission  Medication Sig Dispense Refill Last Dose   aspirin EC 81 MG tablet Take 1 tablet (81 mg total) by mouth daily. Swallow whole. 30 tablet 11 09/17/2021   Prenatal Vit-Fe Fumarate-FA (PRENATAL MULTIVITAMIN) TABS tablet Take 1 tablet by mouth daily at 12 noon.   09/17/2021   acetaminophen (TYLENOL) 500 MG tablet Take 2 tablets (1,000 mg total) by mouth every 8 (eight) hours. (Patient not taking: Reported on 07/12/2021) 60 tablet 1    cyclobenzaprine (FLEXERIL) 5 MG tablet Take 1 tablet (5 mg total) by mouth 3 (three) times daily as needed for muscle spasms. 15 tablet 0    Doxylamine-Pyridoxine (DICLEGIS) 10-10 MG TBEC Take 2 tablets by mouth daily. The dose may  be increased to four tablets orally over the course of the day, as needed, for more severe nausea (two tablets at bedtime and add one tablet midmorning and one tablet in midafternoon if needed) (Patient not taking: Reported on 06/01/2021) 60 tablet 0    nitrofurantoin, macrocrystal-monohydrate, (MACROBID) 100 MG capsule Take 1 capsule (100 mg total) by mouth 2 (two) times daily. (Patient not taking: Reported on 07/12/2021) 14 capsule 0    sertraline (ZOLOFT) 25 MG tablet Take 1 tablet (25 mg total) by mouth daily. (Patient not taking: Reported on 08/09/2021) 30 tablet 3     Review of Systems  Constitutional:  Negative for chills, diaphoresis, fatigue and fever.  Eyes:  Negative for visual disturbance.  Respiratory:  Negative for shortness of breath.   Cardiovascular:  Negative for chest pain.  Gastrointestinal:  Negative  for abdominal pain, constipation, diarrhea, nausea and vomiting.  Genitourinary:  Negative for dysuria, flank pain, frequency, pelvic pain, urgency, vaginal bleeding and vaginal discharge.  Musculoskeletal:  Positive for back pain.  Neurological:  Positive for headaches. Negative for dizziness, weakness and light-headedness.   Physical Exam   Blood pressure 107/66, pulse 99, temperature 98.6 F (37 C), resp. rate 16, height 5\' 3"  (1.6 m), weight 53.4 kg, last menstrual period 01/24/2021, SpO2 100 %, unknown if currently breastfeeding.  Patient Vitals for the past 24 hrs:  BP Temp Pulse Resp SpO2 Height Weight  09/18/21 1719 107/66 -- 99 -- 100 % -- --  09/18/21 1708 114/65 98.6 F (37 C) 100 16 100 % 5\' 3"  (1.6 m) 53.4 kg   Physical Exam Vitals and nursing note reviewed.  Constitutional:      General: She is not in acute distress.    Appearance: Normal appearance. She is not ill-appearing, toxic-appearing or diaphoretic.  HENT:     Head: Normocephalic and atraumatic.  Pulmonary:     Effort: Pulmonary effort is normal.  Abdominal:     Tenderness: There is no right CVA tenderness or left CVA tenderness.  Musculoskeletal:     Cervical back: Normal.     Thoracic back: Normal.     Lumbar back: Normal.  Skin:    General: Skin is warm and dry.  Neurological:     Mental Status: She is alert and oriented to person, place, and time.  Psychiatric:        Mood and Affect: Mood normal.        Behavior: Behavior normal.        Thought Content: Thought content normal.        Judgment: Judgment normal.    Results for orders placed or performed during the hospital encounter of 09/18/21 (from the past 24 hour(s))  Urinalysis, Routine w reflex microscopic Urine, Clean Catch     Status: Abnormal   Collection Time: 09/18/21  5:13 PM  Result Value Ref Range   Color, Urine YELLOW YELLOW   APPearance CLEAR CLEAR   Specific Gravity, Urine 1.016 1.005 - 1.030   pH 7.0 5.0 - 8.0   Glucose, UA  NEGATIVE NEGATIVE mg/dL   Hgb urine dipstick NEGATIVE NEGATIVE   Bilirubin Urine NEGATIVE NEGATIVE   Ketones, ur 5 (A) NEGATIVE mg/dL   Protein, ur NEGATIVE NEGATIVE mg/dL   Nitrite NEGATIVE NEGATIVE   Leukocytes,Ua SMALL (A) NEGATIVE   RBC / HPF 0-5 0 - 5 RBC/hpf   WBC, UA 21-50 0 - 5 WBC/hpf   Bacteria, UA RARE (A) NONE SEEN   Squamous Epithelial / LPF 0-5 0 -  5   Mucus PRESENT    Budding Yeast PRESENT   CBC with Differential/Platelet     Status: Abnormal   Collection Time: 09/18/21  6:49 PM  Result Value Ref Range   WBC 6.6 4.0 - 10.5 K/uL   RBC 3.17 (L) 3.87 - 5.11 MIL/uL   Hemoglobin 9.6 (L) 12.0 - 15.0 g/dL   HCT 16.1 (L) 09.6 - 04.5 %   MCV 89.6 80.0 - 100.0 fL   MCH 30.3 26.0 - 34.0 pg   MCHC 33.8 30.0 - 36.0 g/dL   RDW 40.9 81.1 - 91.4 %   Platelets 104 (L) 150 - 400 K/uL   nRBC 0.0 0.0 - 0.2 %   Neutrophils Relative % 78 %   Neutro Abs 5.1 1.7 - 7.7 K/uL   Lymphocytes Relative 13 %   Lymphs Abs 0.9 0.7 - 4.0 K/uL   Monocytes Relative 9 %   Monocytes Absolute 0.6 0.1 - 1.0 K/uL   Eosinophils Relative 0 %   Eosinophils Absolute 0.0 0.0 - 0.5 K/uL   Basophils Relative 0 %   Basophils Absolute 0.0 0.0 - 0.1 K/uL   Immature Granulocytes 0 %   Abs Immature Granulocytes 0.02 0.00 - 0.07 K/uL  Comprehensive metabolic panel     Status: Abnormal   Collection Time: 09/18/21  6:49 PM  Result Value Ref Range   Sodium 134 (L) 135 - 145 mmol/L   Potassium 3.6 3.5 - 5.1 mmol/L   Chloride 104 98 - 111 mmol/L   CO2 22 22 - 32 mmol/L   Glucose, Bld 76 70 - 99 mg/dL   BUN <5 (L) 6 - 20 mg/dL   Creatinine, Ser 7.82 0.44 - 1.00 mg/dL   Calcium 8.9 8.9 - 95.6 mg/dL   Total Protein 6.5 6.5 - 8.1 g/dL   Albumin 3.0 (L) 3.5 - 5.0 g/dL   AST 17 15 - 41 U/L   ALT 13 0 - 44 U/L   Alkaline Phosphatase 127 (H) 38 - 126 U/L   Total Bilirubin 1.2 0.3 - 1.2 mg/dL   GFR, Estimated >21 >30 mL/min   Anion gap 8 5 - 15   [place] MFM UA CORD DOPPLER  Result Date:  09/18/2021 ----------------------------------------------------------------------  OBSTETRICS REPORT                       (Signed Final 09/18/2021 01:24 pm) ---------------------------------------------------------------------- Patient Info  ID #:       [id number]                          D.O.B.:  2000-03-10 (20 yrs)  Name:       OAKLYN No                   Visit Date: 09/18/2021 11:25 am ---------------------------------------------------------------------- Performed By  Attending:        Ma Rings MD         Ref. Address[facility]     [facility]                                                             [facility]  [facility]  Performed By:     Reinaldo Raddle            [facility]  Referred By:      Warden Fillers MD ---------------------------------------------------------------------- Orders  #  Description                           Code        Ordered By  1  [place] MFM UA CORD DOPPLER                76820.02    YU FANG  2  [place] MFM FETAL BPP                      16109.6     Rosana Hoes     W/NONSTRESS ----------------------------------------------------------------------  #  Order #                     Accession #                Episode #  1  [id number]                   [id number]                 [id number]  2  [id number]                   [id number]                 [id number] ---------------------------------------------------------------------- Indications  Maternal care for known or suspected poor      O36.5930  fetal growth, third trimester, not applicable or  unspecified IUGR  Poor obstetric history: Previous               O09.299  preeclampsia / eclampsia/gestational HTN  Previous cesarean delivery, antepartum          O34.219  Low lying placenta, antepartum (resoloved)     O44.40  [redacted] weeks gestation of pregnancy                Z3A.33 ---------------------------------------------------------------------- Fetal Evaluation  Num Of Fetuses:         1  Fetal  Heart Rate(bpm):  159  Cardiac Activity:       Observed  Presentation:           Cephalic  Placenta:               Posterior  P. Cord Insertion:      Previously Visualized  Amniotic Fluid  AFI FV:      Within normal limits  AFI Sum(cm)     %Tile       Largest Pocket(cm)  8.16            5           3.39  RUQ(cm)       RLQ(cm)       LUQ(cm)        LLQ(cm)  3.39          2.17          1.93           0.67 ---------------------------------------------------------------------- Biophysical Evaluation  Amniotic F.V:   Within normal limits       F. Tone:        Observed  F. Movement:    Observed                   N.S.T:          Reactive  F. Breathing:   Not Observed               Score:          8/10 ---------------------------------------------------------------------- Biometry  LV:          5  mm ---------------------------------------------------------------------- OB History  Gravidity:    2         Term:   1        Prem:   0        SAB:   0  TOP:          0       Ectopic:  0        Living: 1 ---------------------------------------------------------------------- Gestational Age  LMP:           33w 6d        Date:  01/24/21                 EDD:   10/31/21  Best:          33w 6d     Det. By:  LMP  (01/24/21)          EDD:   10/31/21 ---------------------------------------------------------------------- Anatomy  Cranium:               Appears normal         Stomach:                Appears normal, left                                                                        sided  Heart:                 Appears normal         Kidneys:                Appear normal                         (  4CH, axis, and                         situs)  Diaphragm:             Appears normal         Bladder:                 Appears normal ---------------------------------------------------------------------- Doppler - Fetal Vessels  Umbilical Artery   S/D     %tile      RI    %tile      PI    %tile     PSV    ADFV    RDFV                                                     (cm/s)    4.3   > 97.5    0.77   > 97.5    0.94       65    34.85      No      No ---------------------------------------------------------------------- Comments  This patient was seen due to an IUGR fetus.  She denies any  problems since her last exam.  She reports feeling vigorous  fetal movements throughout the day.  A biophysical profile performed today was 8 out of 10 with a  reactive NST.  She received a -2 for fetal breathing  movements that did not meet criteria.  Low normal amniotic fluid with a total AFI of 8.16 cm was  noted today.  Doppler studies of the umbilical arteries performed due to  fetal growth restriction showed an elevated S/D ratio of 4.3.  There were no signs of absent or reversed end-diastolic flow  noted today.  She will return in 1 week for another umbilical artery Doppler  study and BPP. ----------------------------------------------------------------------                   Ma Rings, MD Electronically Signed Final Report   09/18/2021 01:24 pm ----------------------------------------------------------------------  [place] MFM FETAL BPP W/NONSTRESS  Result Date: 09/18/2021 ----------------------------------------------------------------------  OBSTETRICS REPORT                       (Signed Final 09/18/2021 01:24 pm) ---------------------------------------------------------------------- Patient Info  ID #:       [id number]                          D.O.B.:  2000-02-27 (20 yrs)  Name:       EVALISSE Zweig                   Visit Date: 09/18/2021 11:25 am ---------------------------------------------------------------------- Performed By  Attending:        Ma Rings MD         Ref. Address:     [facility]                                                              [facility]  [facility]  Performed By:     Reinaldo Raddle            Location[facility]                                     [facility]  Referred By:      Warden Fillers MD ---------------------------------------------------------------------- Orders  #  Description                           Code        Ordered By  1  [place] MFM UA CORD DOPPLER                76820.02    YU FANG  2  [place] MFM FETAL BPP                      29924.2     Rosana Hoes     W/NONSTRESS ----------------------------------------------------------------------  #  Order #                     Accession #                Episode #  1  [id number]                   [id number]                 [id number]  2  [id number]                   [id number]                 [id number] ---------------------------------------------------------------------- Indications  Maternal care for known or suspected poor      O36.5930  fetal growth, third trimester, not applicable or  unspecified IUGR  Poor obstetric history: Previous               O09.299  preeclampsia / eclampsia/gestational HTN  Previous cesarean delivery, antepartum         O34.219  Low lying placenta, antepartum (resoloved)     O44.40  [redacted] weeks gestation of pregnancy                Z3A.33 ---------------------------------------------------------------------- Fetal Evaluation  Num Of Fetuses:         1  Fetal Heart  Rate(bpm):  159  Cardiac Activity:       Observed  Presentation:           Cephalic  Placenta:               Posterior  P. Cord Insertion:      Previously Visualized  Amniotic Fluid  AFI FV:      Within normal limits  AFI Sum(cm)     %Tile       Largest Pocket(cm)  8.16            5           3.39  RUQ(cm)       RLQ(cm)        LUQ(cm)        LLQ(cm)  3.39          2.17          1.93           0.67 ---------------------------------------------------------------------- Biophysical Evaluation  Amniotic F.V:   Within normal limits       F. Tone:        Observed  F. Movement:    Observed                   N.S.T:          Reactive  F. Breathing:   Not Observed               Score:          8/10 ---------------------------------------------------------------------- Biometry  LV:          5  mm ---------------------------------------------------------------------- OB History  Gravidity:    2         Term:   1        Prem:   0        SAB:   0  TOP:          0       Ectopic:  0        Living: 1 ---------------------------------------------------------------------- Gestational Age  LMP:           33w 6d        Date:  01/24/21                 EDD:   10/31/21  Best:          33w 6d     Det. By:  LMP  (01/24/21)          EDD:   10/31/21 ---------------------------------------------------------------------- Anatomy  Cranium:               Appears normal         Stomach:                Appears normal, left                                                                        sided  Heart:                 Appears normal         Kidneys:                Appear normal                         (  4CH, axis, and                         situs)  Diaphragm:             Appears normal         Bladder:                Appears normal ---------------------------------------------------------------------- Doppler - Fetal Vessels  Umbilical Artery   S/D     %tile      RI    %tile      PI    %tile     PSV    ADFV    RDFV                                                     (cm/s)    4.3   > 97.5    0.77   > 97.5    0.94       65    34.85      No      No ---------------------------------------------------------------------- Comments  This patient was seen due to an IUGR fetus.  She denies any  problems since her last exam.  She reports feeling vigorous  fetal movements  throughout the day.  A biophysical profile performed today was 8 out of 10 with a  reactive NST.  She received a -2 for fetal breathing  movements that did not meet criteria.  Low normal amniotic fluid with a total AFI of 8.16 cm was  noted today.  Doppler studies of the umbilical arteries performed due to  fetal growth restriction showed an elevated S/D ratio of 4.3.  There were no signs of absent or reversed end-diastolic flow  noted today.  She will return in 1 week for another umbilical artery Doppler  study and BPP. ----------------------------------------------------------------------                   Ma Rings, MD Electronically Signed Final Report   09/18/2021 01:24 pm ----------------------------------------------------------------------  [place] MFM FETAL BPP WO NON STRESS  Result Date: 09/11/2021 ----------------------------------------------------------------------  OBSTETRICS REPORT                       (Signed Final 09/11/2021 11:57 am) ---------------------------------------------------------------------- Patient Info  ID #:       [id number]                          D.O.B.:  2000-12-31 (20 yrs)  Name:       Kynzleigh Laker                   Visit Date: 09/11/2021 11:48 am ---------------------------------------------------------------------- Performed By  Attending:        Lin Landsman      Ref[facility]  Performed By:     Joanna Hews         Location[facility]  Referred By:      Warden Fillers MD ---------------------------------------------------------------------- Orders  #   Description                           Code        Ordered By  1  [place] MFM FETAL BPP WO NON               76819.01    YU FANG     STRESS  2  [place] MFM OB FOLLOW UP                   76816.01    YU FANG  3  [place] MFM UA CORD DOPPLER                16109.60    YU FANG ----------------------------------------------------------------------  #  Order #                     Accession #                Episode #  1  [id number]                   [id number]                 [id number]  2  [id number]                   [id number]                 [id number]  3  [id number]                   [id number]  [id number] ---------------------------------------------------------------------- Indications  [redacted] weeks gestation of pregnancy                Z3A.32  Maternal care for known or suspected poor      O36.5930  fetal growth, third trimester, not applicable or  unspecified IUGR  Poor obstetric history: Previous               O09.299  preeclampsia / eclampsia/gestational HTN  Previous cesarean delivery, antepartum         O34.219  Low lying placenta, antepartum (resoloved)     O44.40 ---------------------------------------------------------------------- Fetal Evaluation  Num Of Fetuses:         1  Fetal Heart Rate(bpm):  143  Cardiac Activity:       Observed  Presentation:           Cephalic  Placenta:               Posterior  P. Cord Insertion:      Previously Visualized  Amniotic Fluid  AFI FV:      Within normal limits  AFI Sum(cm)     %Tile       Largest Pocket(cm)  15.28           54          5.65  RUQ(cm)       RLQ(cm)       LUQ(cm)        LLQ(cm)  5.65          4.16          2.5            2.97 ---------------------------------------------------------------------- Biophysical Evaluation  Amniotic F.V:   Within normal limits       F. Tone:        Observed  F. Movement:    Observed                   Score:          8/8  F. Breathing:   Observed ---------------------------------------------------------------------- Biometry  BPD:      84.05  mm     G. Age:  33w 6d         72  %    CI:         80.4   %    70 - 86                                                          FL/HC:      21.3   %    19.9 - 21.5  HC:    296.08   mm     G. Age:  32w 5d         13  %    HC/AC:      1.12        0.96 - 1.11  AC:      264.9  mm     G. Age:  30w 4d          4  %    FL/BPD:     75.2   %    71 - 87  FL:      63.21  mm     G. Age:  32w 5d  33  %    FL/AC:      23.9   %    20 - 24  Est. FW:    1826  gm           4 lb     13  % ---------------------------------------------------------------------- OB History  Gravidity:    2         Term:   1        Prem:   0        SAB:   0  TOP:          0       Ectopic:  0        Living: 1 ---------------------------------------------------------------------- Gestational Age  LMP:           32w 6d        Date:  01/24/21                 EDD:   10/31/21  U/S Today:     32w 3d                                        EDD:   11/03/21  Best:          32w 6d     Det. By:  LMP  (01/24/21)          EDD:   10/31/21 ---------------------------------------------------------------------- Anatomy  Cranium:               Appears normal         Diaphragm:              Appears normal  Ventricles:            Previously seen        Stomach:                Appears normal, left                                                                        sided  Heart:                 Appears normal         Kidneys:                Appear normal                         (4CH, axis, and                         situs)  RVOT:                  Appears normal         Bladder:                Appears normal  LVOT:                  Appears normal  Other:  All anatomy has been previously seen. ---------------------------------------------------------------------- Doppler - Fetal Vessels  Umbilical Artery  S/D     %tile      RI    %tile      PI    %tile     PSV                                                     (cm/s)   3.31       84     0.7       87    0.89        51     44.4 ---------------------------------------------------------------------- Impression  Follow up growth due to known prior IUGR  Normal interval growth with measurements consistent with  dates  Good fetal movement and amniotic fluid volume  The UA Dopplers are normal without evidence of AEDF or  REDF.  Biophysical profile 8/8 ---------------------------------------------------------------------- Recommendations  Continue weekly testing with UA Dopplers. ----------------------------------------------------------------------              Lin Landsman, MD Electronically Signed Final Report   09/11/2021 11:57 am ----------------------------------------------------------------------  [place] MFM OB FOLLOW UP  Result Date: 09/11/2021 ----------------------------------------------------------------------  OBSTETRICS REPORT                       (Signed Final 09/11/2021 11:57 am) ---------------------------------------------------------------------- Patient Info  ID #:       [id number]                          D.O.B.:  2000/12/30 (20 yrs)  Name:       Vega Traweek                   Visit Date: 09/11/2021 11:48 am ---------------------------------------------------------------------- Performed By  Attending:        Lin Landsman      Ref[facility]  Performed By:     Joanna Hews         [facility]  Referred By:      Warden Fillers MD ---------------------------------------------------------------------- Orders  #  Description                           Code        Ordered By  1  [place] MFM  FETAL BPP WO NON               76819.01    YU FANG     STRESS  2  [place] MFM OB FOLLOW UP                   76816.01    YU FANG  3  [place] MFM UA CORD DOPPLER                16109.60    YU FANG ----------------------------------------------------------------------  #  Order #                     Accession #                Episode #  1  [id number]                   [id number]                 [id number]  2  [id number]                   [id number]                 [id number]  3  [id number]                   [id number]                 [id number] ---------------------------------------------------------------------- Indications  [redacted] weeks gestation of pregnancy                Z3A.32  Maternal care for known or suspected poor      O36.5930  fetal growth, third trimester, not applicable or  unspecified IUGR  Poor obstetric history: Previous               O09.299  preeclampsia / eclampsia/gestational HTN  Previous cesarean delivery, antepartum         O34.219  Low lying placenta, antepartum (resoloved)     O44.40 ---------------------------------------------------------------------- Fetal Evaluation  Num Of Fetuses:         1  Fetal Heart Rate(bpm):  143  Cardiac Activity:       Observed  Presentation:           Cephalic  Placenta:               Posterior  P. Cord Insertion:      Previously Visualized  Amniotic Fluid  AFI FV:      Within normal limits  AFI Sum(cm)     %Tile       Largest Pocket(cm)  15.28           54          5.65  RUQ(cm)       RLQ(cm)       LUQ(cm)        LLQ(cm)  5.65          4.16          2.5            2.97 ---------------------------------------------------------------------- Biophysical Evaluation  Amniotic F.V:   Within normal limits       F. Tone:        Observed  F. Movement:    Observed                   Score:          8/8  F. Breathing:   Observed ---------------------------------------------------------------------- Biometry  BPD:     84.05  mm     G. Age:  33w 6d         72  %    CI:         80.4   %    70  - 86                                                          FL/HC:      21.3   %    19.9 - 21.5  HC:    296.08   mm     G. Age:  32w 5d         13  %    HC/AC:      1.12        0.96 - 1.11  AC:      264.9  mm     G. Age:  30w 4d          4  %    FL/BPD:     75.2   %    71 - 87  FL:      63.21  mm     G. Age:  32w 5d         33  %    FL/AC:      23.9   %    20 - 24  Est. FW:    1826  gm           4 lb     13  % ---------------------------------------------------------------------- OB History  Gravidity:    2         Term:   1        Prem:   0        SAB:   0  TOP:          0       Ectopic:  0        Living: 1 ---------------------------------------------------------------------- Gestational Age  LMP:           32w 6d        Date:  01/24/21                 EDD:   10/31/21  U/S Today:     32w 3d  EDD:   11/03/21  Best:          Armida Sans 6d     Det. By:  LMP  (01/24/21)          EDD:   10/31/21 ---------------------------------------------------------------------- Anatomy  Cranium:               Appears normal         Diaphragm:              Appears normal  Ventricles:            Previously seen        Stomach:                Appears normal, left                                                                        sided  Heart:                 Appears normal         Kidneys:                Appear normal                         (4CH, axis, and                         situs)  RVOT:                  Appears normal         Bladder:                Appears normal  LVOT:                  Appears normal  Other:  All anatomy has been previously seen. ---------------------------------------------------------------------- Doppler - Fetal Vessels  Umbilical Artery   S/D     %tile      RI    %tile      PI    %tile     PSV                                                     (cm/s)   3.31       84     0.7       87    0.89       51     44.4  ---------------------------------------------------------------------- Impression  Follow up growth due to known prior IUGR  Normal interval growth with measurements consistent with  dates  Good fetal movement and amniotic fluid volume  The UA Dopplers are normal without evidence of AEDF or  REDF.  Biophysical profile 8/8 ---------------------------------------------------------------------- Recommendations  Continue weekly testing with UA Dopplers. ----------------------------------------------------------------------              Lin Landsman, MD Electronically Signed Final Report   09/11/2021 11:57 am ----------------------------------------------------------------------  [place] MFM UA CORD DOPPLER  Result Date: 09/11/2021 ----------------------------------------------------------------------  OBSTETRICS  REPORT                       (Signed Final 09/11/2021 11:57 am) ---------------------------------------------------------------------- Patient Info  ID #:       [id number]                          D.O.B.:  08/04/2000 (20 yrs)  Name:       [place]                   Visit Date: 09/11/2021 11:48 am ---------------------------------------------------------------------- Performed By  Attending:        Lin Landsman      Ref. Address[facility]     [facility]  Performed By:     Joanna Hews         Location[facility]  Referred By:      Warden Fillers MD ---------------------------------------------------------------------- Orders  #  Description                           Code        Ordered By  1  [place] MFM FETAL BPP  WO NON               40981.19    YU FANG     STRESS  2  [place] MFM OB FOLLOW UP  16109.60    YU FANG  3  [place] MFM UA CORD DOPPLER                [id number]    YU FANG ----------------------------------------------------------------------  #  Order #                     Accession #                Episode #  1  [id number]                   [id number]                 [id number]  2  [id number]                   [id number]                 [id number]  3  [id number]                   [id number]                 [id number] ---------------------------------------------------------------------- Indications  [redacted] weeks gestation of pregnancy                Z3A.32  Maternal care for known or suspected poor      O36.5930  fetal growth, third trimester, not applicable or  unspecified IUGR  Poor obstetric history: Previous               O09.299  preeclampsia / eclampsia/gestational HTN  Previous cesarean delivery, antepartum         O34.219  Low lying placenta, antepartum (resoloved)     O44.40 ---------------------------------------------------------------------- Fetal Evaluation  Num Of Fetuses:         1  Fetal Heart Rate(bpm):  143  Cardiac Activity:       Observed  Presentation:           Cephalic  Placenta:               Posterior  P. Cord Insertion:      Previously Visualized  Amniotic Fluid  AFI FV:      Within normal limits  AFI Sum(cm)     %Tile       Largest Pocket(cm)  15.28           54          5.65  RUQ(cm)       RLQ(cm)       LUQ(cm)        LLQ(cm)  5.65          4.16          2.5            2.97 ---------------------------------------------------------------------- Biophysical Evaluation  Amniotic F.V:   Within normal limits       F. Tone:        Observed  F. Movement:    Observed                   Score:          8/8  F. Breathing:   Observed ---------------------------------------------------------------------- Biometry  BPD:     84.05  mm     G. Age:  33w 6d         72  %    CI:         80.4   %  70 - 86                                                           FL/HC:      21.3   %    19.9 - 21.5  HC:    296.08   mm     G. Age:  32w 5d         13  %    HC/AC:      1.12        0.96 - 1.11  AC:      264.9  mm     G. Age:  30w 4d          4  %    FL/BPD:     75.2   %    71 - 87  FL:      63.21  mm     G. Age:  32w 5d         33  %    FL/AC:      23.9   %    20 - 24  Est. FW:    1826  gm           4 lb     13  % ---------------------------------------------------------------------- OB History  Gravidity:    2         Term:   1        Prem:   0        SAB:   0  TOP:          0       Ectopic:  0        Living: 1 ---------------------------------------------------------------------- Gestational Age  LMP:           32w 6d        Date:  01/24/21                 EDD:   10/31/21  U/S Today:     32w 3d                                        EDD:   11/03/21  Best:          32w 6d     Det. By:  LMP  (01/24/21)          EDD:   10/31/21 ---------------------------------------------------------------------- Anatomy  Cranium:               Appears normal         Diaphragm:              Appears normal  Ventricles:            Previously seen        Stomach:                Appears normal, left  sided  Heart:                 Appears normal         Kidneys:                Appear normal                         (4CH, axis, and                         situs)  RVOT:                  Appears normal         Bladder:                Appears normal  LVOT:                  Appears normal  Other:  All anatomy has been previously seen. ---------------------------------------------------------------------- Doppler - Fetal Vessels  Umbilical Artery   S/D     %tile      RI    %tile      PI    %tile     PSV                                                     (cm/s)   3.31       84     0.7       87    0.89       51     44.4 ----------------------------------------------------------------------  Impression  Follow up growth due to known prior IUGR  Normal interval growth with measurements consistent with  dates  Good fetal movement and amniotic fluid volume  The UA Dopplers are normal without evidence of AEDF or  REDF.  Biophysical profile 8/8 ---------------------------------------------------------------------- Recommendations  Continue weekly testing with UA Dopplers. ----------------------------------------------------------------------              Lin Landsman, MD Electronically Signed Final Report   09/11/2021 11:57 am ----------------------------------------------------------------------  [place] MFM FETAL BPP WO NON STRESS  Result Date: 09/04/2021 ----------------------------------------------------------------------  OBSTETRICS REPORT                       (Signed Final 09/04/2021 12:18 pm) ---------------------------------------------------------------------- Patient Info  ID #:       [id number]                          D.O.B.:  Sep 13, 2000 (20 yrs)  Name:       Elaina Pattee                   Visit Date: 09/04/2021 10:46 am ---------------------------------------------------------------------- Performed By  Attending:        Ma Rings MD         Ref. Address[facility]     [facility]  Performed By:     Reinaldo Raddle            Location[facility]         [facility]  Referred By:      Warden Fillers MD ---------------------------------------------------------------------- Orders  #  Description                           Code        Ordered By  1  [place] MFM FETAL BPP WO NON               76819.01    YU FANG     STRESS  2  [place] MFM UA CORD DOPPLER                 76820.02    YU FANG ----------------------------------------------------------------------  #  Order #                     Accession #                Episode #  1  [id number]                   [id number]                 [id number]  2  [id number]                   [id number]                 [id number] ---------------------------------------------------------------------- Indications  Maternal care for known or suspected poor      O36.5930  fetal growth, third trimester, not applicable or  unspecified IUGR  Poor obstetric history: Previous               O09.299  preeclampsia / eclampsia/gestational HTN  Previous cesarean delivery, antepartum  O34.219  Low lying placenta, antepartum (resoloved)     O44.40  [redacted] weeks gestation of pregnancy                Z3A.31 ---------------------------------------------------------------------- Fetal Evaluation  Num Of Fetuses:         1  Fetal Heart Rate(bpm):  137  Cardiac Activity:       Observed  Presentation:           Cephalic  Placenta:               Posterior  P. Cord Insertion:      Previously Visualized  Amniotic Fluid  AFI FV:      Within normal limits  AFI Sum(cm)     %Tile       Largest Pocket(cm)  9.52            12          3.89  RUQ(cm)       RLQ(cm)       LUQ(cm)        LLQ(cm)  3.89          0             3.28           2.35 ---------------------------------------------------------------------- Biophysical Evaluation  Amniotic F.V:   Within normal limits       F. Tone:        Observed  F. Movement:    Observed                   Score:          8/8  F. Breathing:   Observed ---------------------------------------------------------------------- Biometry  LV:        2.4  mm ---------------------------------------------------------------------- OB History  Gravidity:    2         Term:   1        Prem:   0        SAB:   0  TOP:          0       Ectopic:  0        Living: 1 ---------------------------------------------------------------------- Gestational  Age  LMP:           31w 6d        Date:  01/24/21                 EDD:   10/31/21  Best:          Bobbye Riggs 6d     Det. By:  LMP  (01/24/21)          EDD:   10/31/21 ---------------------------------------------------------------------- Anatomy  Ventricles:            Appears normal         Kidneys:                Appear normal  Diaphragm:             Appears normal         Bladder:                Appears normal  Stomach:               Appears normal, left                         sided ---------------------------------------------------------------------- Doppler - Fetal Vessels  Umbilical Artery  S/D     %tile      RI    %tile      PI    %tile     PSV    ADFV    RDFV                                                     (cm/s)   3.13       75    0.68       78    1.08       80    36.65      No      No ---------------------------------------------------------------------- Cervix Uterus Adnexa  Cervix  Not visualized (advanced GA >24wks) ---------------------------------------------------------------------- Comments  This patient was seen due to IUGR.  She denies any  problems since her last exam.  A BPP performed today was 8 out of 8.  There was normal amniotic fluid noted today.  Doppler studies of the umbilical arteries showed a normal  S/D ratio of 3.13.  There were no signs of absent or reversed  end-diastolic flow.  An umbilical artery Doppler study and BPPP was scheduled  in 1 week. ----------------------------------------------------------------------                   Ma Rings, MD Electronically Signed Final Report   09/04/2021 12:18 pm ----------------------------------------------------------------------  [place] MFM UA CORD DOPPLER  Result Date: 09/04/2021 ----------------------------------------------------------------------  OBSTETRICS REPORT                       (Signed Final 09/04/2021 12:18 pm) ---------------------------------------------------------------------- Patient Info  ID #:       [id number]                           D.O.B.:  03-29-2000 (20 yrs)  Name:       Elaina Pattee                   Visit Date: 09/04/2021 10:46 am ---------------------------------------------------------------------- Performed By  Attending:        Ma Rings MD         Ref[facility] Address[facility]                                                             [facility]  Performed By:     Reinaldo Raddle            Location[facility]  Referred By:      Warden Fillers MD ---------------------------------------------------------------------- Orders  #  Description                           Code        Ordered By  1  [place] MFM FETAL BPP WO NON               76819.01    YU FANG     STRESS  2  [place] MFM UA CORD DOPPLER                76820.02    YU FANG ----------------------------------------------------------------------  #  Order #                     Accession #                Episode #  1  [id number]                   [id number]                 [id number]  2  [id number]                   [id number]                 [id number] ---------------------------------------------------------------------- Indications  Maternal care for known or suspected poor      O36.5930  fetal growth, third trimester, not applicable or  unspecified IUGR  Poor obstetric history: Previous               O09.299  preeclampsia / eclampsia/gestational HTN  Previous cesarean delivery, antepartum         O34.219  Low lying placenta, antepartum (resoloved)     O44.40  [redacted] weeks gestation of pregnancy                Z3A.31 ---------------------------------------------------------------------- Fetal Evaluation  Num Of Fetuses:         1  Fetal Heart Rate(bpm):  137  Cardiac  Activity:       Observed  Presentation:           Cephalic  Placenta:               Posterior  P. Cord Insertion:      Previously Visualized  Amniotic Fluid  AFI FV:      Within normal limits  AFI Sum(cm)     %Tile       Largest Pocket(cm)  9.52  12          3.89  RUQ(cm)       RLQ(cm)       LUQ(cm)        LLQ(cm)  3.89          0             3.28           2.35 ---------------------------------------------------------------------- Biophysical Evaluation  Amniotic F.V:   Within normal limits       F. Tone:        Observed  F. Movement:    Observed                   Score:          8/8  F. Breathing:   Observed ---------------------------------------------------------------------- Biometry  LV:        2.4  mm ---------------------------------------------------------------------- OB History  Gravidity:    2         Term:   1        Prem:   0        SAB:   0  TOP:          0       Ectopic:  0        Living: 1 ---------------------------------------------------------------------- Gestational Age  LMP:           31w 6d        Date:  01/24/21                 EDD:   10/31/21  Best:          Bobbye Riggs 6d     Det. By:  LMP  (01/24/21)          EDD:   10/31/21 ---------------------------------------------------------------------- Anatomy  Ventricles:            Appears normal         Kidneys:                Appear normal  Diaphragm:             Appears normal         Bladder:                Appears normal  Stomach:               Appears normal, left                         sided ---------------------------------------------------------------------- Doppler - Fetal Vessels  Umbilical Artery   S/D     %tile      RI    %tile      PI    %tile     PSV    ADFV    RDFV                                                     (cm/s)   3.13       75    0.68       78    1.08       80    36.65      No      No ---------------------------------------------------------------------- Cervix Uterus Adnexa  Cervix  Not visualized (advanced GA  >  24wks) ---------------------------------------------------------------------- Comments  This patient was seen due to IUGR.  She denies any  problems since her last exam.  A BPP performed today was 8 out of 8.  There was normal amniotic fluid noted today.  Doppler studies of the umbilical arteries showed a normal  S/D ratio of 3.13.  There were no signs of absent or reversed  end-diastolic flow.  An umbilical artery Doppler study and BPPP was scheduled  in 1 week. ----------------------------------------------------------------------                   Ma Rings, MD Electronically Signed Final Report   09/04/2021 12:18 pm ----------------------------------------------------------------------  [place] MFM UA CORD DOPPLER  Result Date: 08/28/2021 ----------------------------------------------------------------------  OBSTETRICS REPORT                       (Signed Final 08/28/2021 03:47 pm) ---------------------------------------------------------------------- Patient Info  ID #:       [id number]                          D.O.B.:  16-Jan-2001 (20 yrs)  Name:       Elaina Pattee                   Visit Date: 08/28/2021 02:13 pm ---------------------------------------------------------------------- Performed By  Attending:        Noralee Space MD        Ref. Address[facility]     [facility]                                                             [facility]  Performed By:     Joanna Hews         Location[facility]  Referred By:  Warden Fillers MD ---------------------------------------------------------------------- Orders  #  Description                           Code        Ordered By  1  [place] MFM UA CORD  DOPPLER                76820.02    YU FANG  2  [place] MFM OB LIMITED                     [id number]    YU FANG ----------------------------------------------------------------------  #  Order #                     Accession #                Episode #  1  [id number]                   [id number]                 [id number]  2  [id number]                   [id number]                 [id number] ---------------------------------------------------------------------- Indications  [redacted] weeks gestation of pregnancy                Z3A.30  Maternal care for known or suspected poor      O36.5930  fetal growth, third trimester, not applicable or  unspecified IUGR  Poor obstetric history: Previous               O09.299  preeclampsia / eclampsia/gestational HTN  Previous cesarean delivery, antepartum         O34.219  Low lying placenta, antepartum (resoloved)     O44.40 ---------------------------------------------------------------------- Vital Signs  BP:          111/58 ---------------------------------------------------------------------- Fetal Evaluation  Num Of Fetuses:         1  Fetal Heart Rate(bpm):  154  Cardiac Activity:       Observed  Presentation:           Cephalic  Placenta:               Posterior  P. Cord Insertion:      Previously Visualized  Amniotic Fluid  AFI FV:      Within normal limits  AFI Sum(cm)     %Tile       Largest Pocket(cm)  10.6            19          3.67  RUQ(cm)       RLQ(cm)       LUQ(cm)        LLQ(cm)  0             3.67          3.6            3.33 ---------------------------------------------------------------------- OB History  Gravidity:    2         Term:   1        Prem:   0        SAB:   0  TOP:  0       Ectopic:  0        Living: 1 ---------------------------------------------------------------------- Gestational Age  LMP:           30w 6d        Date:  01/24/21                 EDD:   10/31/21  Best:          Georgiann Hahn 6d     Det. By:  LMP  (01/24/21)          EDD:   10/31/21  ---------------------------------------------------------------------- Anatomy  Heart:                 Appears normal         Stomach:                Appears normal, left                         (4CH, axis, and                                sided                         situs)  RVOT:                  Appears normal         Abdomen:                Appears normal  LVOT:                  Appears normal         Kidneys:                Appear normal  Diaphragm:             Appears normal         Bladder:                Appears normal ---------------------------------------------------------------------- Doppler - Fetal Vessels  Umbilical Artery   S/D     %tile      RI    %tile      PI    %tile     PSV                                                     (cm/s)   3.15       71    0.68       74    1.09       79    39.97 ---------------------------------------------------------------------- Impression  Fetal growth restriction.  On previous fetal growth  assessment, the estimated fetal weight was at the 6  percentile and the abdominal circumference measurement  was at the 2nd percentile.  Amniotic fluid is normal and good fetal activity seen.  Umbilical artery Doppler showed normal forward diastolic  flow.  NST is reactive. ---------------------------------------------------------------------- Recommendations  -Continue weekly antenatal testing till delivery ----------------------------------------------------------------------                 Noralee Space, MD Electronically Signed Final Report   08/28/2021 03:47 pm ----------------------------------------------------------------------  [place] MFM OB LIMITED  Result Date: 08/28/2021 ----------------------------------------------------------------------  OBSTETRICS REPORT                       (Signed Final 08/28/2021 03:47 pm) ---------------------------------------------------------------------- Patient Info  ID #:       [id number]                          D.O.B.:  Dec 15, 2000  (20 yrs)  Name:       Elaina Pattee                   Visit Date: 08/28/2021 02:13 pm ---------------------------------------------------------------------- Performed By  Attending:        Noralee Space MD        [facility]     [facility]                                                             Healthcare  Performed By:     Joanna Hews         [facility]  Referred By:      Warden Fillers MD ---------------------------------------------------------------------- Orders  #  Description                           Code        Ordered By  1  [place] MFM UA CORD DOPPLER                76820.02    YU FANG  2  [place] MFM OB LIMITED  40981.19    Rosana Hoes ----------------------------------------------------------------------  #  Order #                     Accession #                Episode #  1  [id number]                   [id number]                 [id number]  2  [id number]                   [id number]                 [id number] ---------------------------------------------------------------------- Indications  [redacted] weeks gestation of pregnancy                Z3A.30  Maternal care for known or suspected poor      O36.5930  fetal growth, third trimester, not applicable or  unspecified IUGR  Poor obstetric history: Previous               O09.299  preeclampsia / eclampsia/gestational HTN  Previous cesarean delivery, antepartum         O34.219  Low lying placenta, antepartum (resoloved)     O44.40 ---------------------------------------------------------------------- Vital Signs  BP:          111/58 ---------------------------------------------------------------------- Fetal Evaluation  Num Of  Fetuses:         1  Fetal Heart Rate(bpm):  154  Cardiac Activity:       Observed  Presentation:           Cephalic  Placenta:               Posterior  P. Cord Insertion:      Previously Visualized  Amniotic Fluid  AFI FV:      Within normal limits  AFI Sum(cm)     %Tile       Largest Pocket(cm)  10.6            19          3.67  RUQ(cm)       RLQ(cm)       LUQ(cm)        LLQ(cm)  0             3.67          3.6            3.33 ---------------------------------------------------------------------- OB History  Gravidity:    2         Term:   1        Prem:   0        SAB:   0  TOP:          0       Ectopic:  0        Living: 1 ---------------------------------------------------------------------- Gestational Age  LMP:           30w 6d        Date:  01/24/21                 EDD:   10/31/21  Best:          Georgiann Hahn 6d     Det. By:  LMP  (01/24/21)          EDD:   10/31/21 ---------------------------------------------------------------------- Anatomy  Heart:  Appears normal         Stomach:                Appears normal, left                         (4CH, axis, and                                sided                         situs)  RVOT:                  Appears normal         Abdomen:                Appears normal  LVOT:                  Appears normal         Kidneys:                Appear normal  Diaphragm:             Appears normal         Bladder:                Appears normal ---------------------------------------------------------------------- Doppler - Fetal Vessels  Umbilical Artery   S/D     %tile      RI    %tile      PI    %tile     PSV                                                     (cm/s)   3.15       71    0.68       74    1.09       79    39.97 ---------------------------------------------------------------------- Impression  Fetal growth restriction.  On previous fetal growth  assessment, the estimated fetal weight was at the 6  percentile and the abdominal circumference measurement  was  at the 2nd percentile.  Amniotic fluid is normal and good fetal activity seen.  Umbilical artery Doppler showed normal forward diastolic  flow.  NST is reactive. ---------------------------------------------------------------------- Recommendations  -Continue weekly antenatal testing till delivery ----------------------------------------------------------------------                 Noralee Space, MD Electronically Signed Final Report   08/28/2021 03:47 pm ----------------------------------------------------------------------  [place] MFM OB FOLLOW UP  Result Date: 08/21/2021 ----------------------------------------------------------------------  OBSTETRICS REPORT                       (Signed Final 08/21/2021 12:57 pm) ---------------------------------------------------------------------- Patient Info  ID #:       [id number]                          D.O.B.:  Dec 17, 2000 (20 yrs)  Name:       Jakeline Tanney                   Visit Date: 08/21/2021 11:26 am ---------------------------------------------------------------------- Performed By  Attending:  Ma Rings MD         Ref. Address:     [facility]                                                             [facility]                                                             Healthcare  Performed By:     Joanna Hews         Location[facility]  Referred By:      Warden Fillers MD ---------------------------------------------------------------------- Orders  #  Description                           Code        Ordered By  1  [place] MFM OB FOLLOW UP                   [phone]    YU FANG  2  US OB TRANSVAGINAL                    13086.5     Doralee Albino  ----------------------------------------------------------------------  #  Order #                     Accession #                Episode #  1  [id number]                   [id number]                 [id number]  2  [id number]                   [id number]  [id number] ---------------------------------------------------------------------- Indications  Maternal care for known or suspected poor      O36.5930  fetal growth, third trimester, not applicable or  unspecified IUGR  [redacted] weeks gestation of pregnancy                Z3A.29  Poor obstetric history: Previous               O09.299  preeclampsia / eclampsia/gestational HTN  Previous cesarean delivery, antepartum         O34.219  Low lying placenta, antepartum (resoloved)     O44.40 ---------------------------------------------------------------------- Fetal Evaluation  Num Of Fetuses:         1  Fetal Heart Rate(bpm):  148  Cardiac Activity:       Observed  Presentation:           Cephalic  Placenta:               Posterior  P. Cord Insertion:      Previously Visualized  Amniotic Fluid  AFI FV:      Within normal limits  AFI Sum(cm)     %Tile       Largest Pocket(cm)  10.6            18          4.11  RUQ(cm)       RLQ(cm)       LUQ(cm)        LLQ(cm)  4.11          2.1           2.98           1.41 ---------------------------------------------------------------------- Biometry  BPD:     70.46  mm     G. Age:  28w 2d          5  %    CI:        70.97   %    70 - 86                                                          FL/HC:      21.1   %    19.2 - 21.4  HC:    266.49   mm     G. Age:  29w 0d        4.8  %    HC/AC:      1.15        0.99 - 1.21  AC:    231.41   mm     G. Age:  27w 3d        2.1  %    FL/BPD:     79.9   %    71 - 87  FL:      56.27  mm     G. Age:  29w 4d         27  %    FL/AC:      24.3   %    20 - 24  LV:        2.3  mm  Est. FW:    1223  gm    2 lb 11 oz       6  % ----------------------------------------------------------------------  OB History  Gravidity:  2         Term:   1        Prem:   0        SAB:   0  TOP:          0       Ectopic:  0        Living: 1 ---------------------------------------------------------------------- Gestational Age  LMP:           29w 6d        Date:  01/24/21                 EDD:   10/31/21  U/S Today:     28w 4d                                        EDD:   11/09/21  Best:          29w 6d     Det. By:  LMP  (01/24/21)          EDD:   10/31/21 ---------------------------------------------------------------------- Anatomy  Cranium:               Appears normal         Aortic Arch:            Previously seen  Cavum:                 Appears normal         Ductal Arch:            Previously seen  Ventricles:            Appears normal         Diaphragm:              Appears normal  Choroid Plexus:        Previously seen        Stomach:                Appears normal, left                                                                        sided  Cerebellum:            Previously seen        Abdomen:                Appears normal  Posterior Fossa:       Previously seen        Abdominal Wall:         Previously seen  Nuchal Fold:           Previously seen        Cord Vessels:           Previously seen  Face:                  Orbits and profile     Kidneys:                Appear normal  previously seen  Lips:                  Appears normal         Bladder:                Appears normal  Thoracic:              Appears normal         Spine:                  Previously seen  Heart:                 Appears normal         Upper Extremities:      Previously seen                         (4CH, axis, and                         situs)  RVOT:                  Appears normal         Lower Extremities:      Previously seen  LVOT:                  Appears normal  Other:  Heels/feet and open hands/5th digits visualized. Nasal bone, lenses,          and falx visualized. VC, 3VV and 3VTV previously visualized.  Female          gender previously seen. ---------------------------------------------------------------------- Doppler - Fetal Vessels  Umbilical Artery   S/D     %tile      RI    %tile      PI    %tile     PSV    ADFV    RDFV                                                     (cm/s)   3.29       74     0.7       78     1.1       77    38.57      No      No ---------------------------------------------------------------------- Cervix Uterus Adnexa  Cervix  Length:            3.4  cm.  Normal appearance by transvaginal scan  Uterus  No abnormality visualized.  Right Ovary  Not visualized.  Left Ovary  Not visualized. ---------------------------------------------------------------------- Comments  This patient was seen for a follow up exam as a low-lying  placenta was noted on an earlier ultrasound exam.  She  denies any problems since her last exam and reports feeling  vigorous fetal movements throughout the day.  On today's exam, the EFW measures at the 6th percentile for  her gestational age indicating fetal growth restriction. There  was normal amniotic fluid noted.  The patient reports that her prior pregnancy was also  complicated by an IUGR fetus.  Fetal movements were noted throughout today's exam.  Doppler studies of the umbilical arteries showed a normal  S/D ratio of 3.29.  There were no signs of  absent or reversed  end-diastolic flow.  The previously noted low-lying placenta has resolved on  today's exam.  This was confirmed using transvaginal  ultrasound.  The patient was advised that she may attempt a  vaginal delivery at term.  Due to fetal growth restriction, we will continue to follow her  with weekly fetal testing and umbilical artery Doppler studies.  An umbilical artery Doppler study and NST was scheduled in  1 week. ----------------------------------------------------------------------                   Ma Rings, MD Electronically Signed Final Report   08/21/2021 12:57 pm  ----------------------------------------------------------------------  US OB Transvaginal  Result Date: 08/21/2021 ----------------------------------------------------------------------  OBSTETRICS REPORT                       (Signed Final 08/21/2021 12:57 pm) ---------------------------------------------------------------------- Patient Info  ID #:       [id number]                          D.O.B.:  2000-08-20 (20 yrs)  Name:       Elaina Pattee                   Visit Date: 08/21/2021 11:26 am ---------------------------------------------------------------------- Performed By  Attending:        Ma Rings MD         Ref. Address:     [facility]                                                             Healthcare  Performed By:     Joanna Hews         Location[facility]  Referred By:      Regis Bill  BASS MD ---------------------------------------------------------------------- Orders  #  Description                           Code        Ordered By  1  [place] MFM OB FOLLOW UP                   76816.01    YU FANG  2  US OB TRANSVAGINAL                    [id number]     Doralee Albino ----------------------------------------------------------------------  #  Order #                     Accession #                Episode #  1  [id number]                   [id number]                 [id number]  2  [id number]                   [id number]                 [id number] ---------------------------------------------------------------------- Indications  Maternal care for known or suspected poor      O36.5930  fetal growth, third trimester, not applicable or  unspecified IUGR  [redacted] weeks gestation of pregnancy                 Z3A.29  Poor obstetric history: Previous               O09.299  preeclampsia / eclampsia/gestational HTN  Previous cesarean delivery, antepartum         O34.219  Low lying placenta, antepartum (resoloved)     O44.40 ---------------------------------------------------------------------- Fetal Evaluation  Num Of Fetuses:         1  Fetal Heart Rate(bpm):  148  Cardiac Activity:       Observed  Presentation:           Cephalic  Placenta:               Posterior  P. Cord Insertion:      Previously Visualized  Amniotic Fluid  AFI FV:      Within normal limits  AFI Sum(cm)     %Tile       Largest Pocket(cm)  10.6            18          4.11  RUQ(cm)       RLQ(cm)       LUQ(cm)        LLQ(cm)  4.11          2.1           2.98           1.41 ---------------------------------------------------------------------- Biometry  BPD:     70.46  mm     G. Age:  28w 2d          5  %    CI:        70.97   %    70 - 86  FL/HC:      21.1   %    19.2 - 21.4  HC:    266.49   mm     G. Age:  29w 0d        4.8  %    HC/AC:      1.15        0.99 - 1.21  AC:    231.41   mm     G. Age:  27w 3d        2.1  %    FL/BPD:     79.9   %    71 - 87  FL:      56.27  mm     G. Age:  29w 4d         27  %    FL/AC:      24.3   %    20 - 24  LV:        2.3  mm  Est. FW:    1223  gm    2 lb 11 oz       6  % ---------------------------------------------------------------------- OB History  Gravidity:    2         Term:   1        Prem:   0        SAB:   0  TOP:          0       Ectopic:  0        Living: 1 ---------------------------------------------------------------------- Gestational Age  LMP:           29w 6d        Date:  01/24/21                 EDD:   10/31/21  U/S Today:     28w 4d                                        EDD:   11/09/21  Best:          29w 6d     Det. By:  LMP  (01/24/21)          EDD:   10/31/21 ---------------------------------------------------------------------- Anatomy   Cranium:               Appears normal         Aortic Arch:            Previously seen  Cavum:                 Appears normal         Ductal Arch:            Previously seen  Ventricles:            Appears normal         Diaphragm:              Appears normal  Choroid Plexus:        Previously seen        Stomach:                Appears normal, left  sided  Cerebellum:            Previously seen        Abdomen:                Appears normal  Posterior Fossa:       Previously seen        Abdominal Wall:         Previously seen  Nuchal Fold:           Previously seen        Cord Vessels:           Previously seen  Face:                  Orbits and profile     Kidneys:                Appear normal                         previously seen  Lips:                  Appears normal         Bladder:                Appears normal  Thoracic:              Appears normal         Spine:                  Previously seen  Heart:                 Appears normal         Upper Extremities:      Previously seen                         (4CH, axis, and                         situs)  RVOT:                  Appears normal         Lower Extremities:      Previously seen  LVOT:                  Appears normal  Other:  Heels/feet and open hands/5th digits visualized. Nasal bone, lenses,          and falx visualized. VC, 3VV and 3VTV previously visualized. Female          gender previously seen. ---------------------------------------------------------------------- Doppler - Fetal Vessels  Umbilical Artery   S/D     %tile      RI    %tile      PI    %tile     PSV    ADFV    RDFV                                                     (cm/s)   3.29       74     0.7       78     1.1       77    38.57  No      No ---------------------------------------------------------------------- Cervix Uterus Adnexa  Cervix  Length:            3.4  cm.  Normal appearance by transvaginal scan   Uterus  No abnormality visualized.  Right Ovary  Not visualized.  Left Ovary  Not visualized. ---------------------------------------------------------------------- Comments  This patient was seen for a follow up exam as a low-lying  placenta was noted on an earlier ultrasound exam.  She  denies any problems since her last exam and reports feeling  vigorous fetal movements throughout the day.  On today's exam, the EFW measures at the 6th percentile for  her gestational age indicating fetal growth restriction. There  was normal amniotic fluid noted.  The patient reports that her prior pregnancy was also  complicated by an IUGR fetus.  Fetal movements were noted throughout today's exam.  Doppler studies of the umbilical arteries showed a normal  S/D ratio of 3.29.  There were no signs of absent or reversed  end-diastolic flow.  The previously noted low-lying placenta has resolved on  today's exam.  This was confirmed using transvaginal  ultrasound.  The patient was advised that she may attempt a  vaginal delivery at term.  Due to fetal growth restriction, we will continue to follow her  with weekly fetal testing and umbilical artery Doppler studies.  An umbilical artery Doppler study and NST was scheduled in  1 week. ----------------------------------------------------------------------                   Ma Rings, MD Electronically Signed Final Report   08/21/2021 12:57 pm ----------------------------------------------------------------------   MAU Course  Procedures  MDM -HA and back pain -UA: 5ketones/sm leuks/rare bacteria, sending urine for culture -CBC: H/H 9.6/28.4, platelets 104 -CMP: no abnormalities requiring treatment (normal liver enzymes) -symptoms resolved with Tylenol and Flexeril -EFM: reactive       -baseline: 150       -variability: moderate       -accels: present, 15x15       -decels: absent       -TOCO: irritability -pt discharged to home in stable condition  Orders Placed This  Encounter  Procedures   Culture, OB Urine    Standing Status:   Standing    Number of Occurrences:   1   Urinalysis, Routine w reflex microscopic Urine, Clean Catch    Standing Status:   Standing    Number of Occurrences:   1   CBC with Differential/Platelet    Standing Status:   Standing    Number of Occurrences:   1   Comprehensive metabolic panel    Standing Status:   Standing    Number of Occurrences:   1   Ice to affected area    Standing Status:   Standing    Number of Occurrences:   1    Meds ordered this encounter  Medications   acetaminophen (TYLENOL) tablet 1,000 mg   cyclobenzaprine (FLEXERIL) tablet 5 mg   Assessment and Plan   1. Thrombocytopenia affecting pregnancy (HCC)   2. Anemia affecting pregnancy in third trimester   3. Back pain in pregnancy   4. [redacted] weeks gestation of pregnancy   5. NST (non-stress test) reactive     Allergies as of 09/18/2021   No Known Allergies      Medication List     TAKE these medications    acetaminophen 500 MG tablet Commonly known as: TYLENOL Take 2 tablets (1,000  mg total) by mouth every 8 (eight) hours.   aspirin EC 81 MG tablet Take 1 tablet (81 mg total) by mouth daily. Swallow whole.   cyclobenzaprine 5 MG tablet Commonly known as: FLEXERIL Take 1 tablet (5 mg total) by mouth 3 (three) times daily as needed for muscle spasms.   Doxylamine-Pyridoxine 10-10 MG Tbec Commonly known as: Diclegis Take 2 tablets by mouth daily. The dose may be increased to four tablets orally over the course of the day, as needed, for more severe nausea (two tablets at bedtime and add one tablet midmorning and one tablet in midafternoon if needed)   ferrous sulfate 325 (65 FE) MG tablet Take 1 tablet (325 mg total) by mouth every other day.   nitrofurantoin (macrocrystal-monohydrate) 100 MG capsule Commonly known as: MACROBID Take 1 capsule (100 mg total) by mouth 2 (two) times daily.   prenatal multivitamin Tabs tablet Take  1 tablet by mouth daily at 12 noon.   sertraline 25 MG tablet Commonly known as: Zoloft Take 1 tablet (25 mg total) by mouth daily.       -RX PO iron -referral pelvic physical therapy -return MAU precautions given -pt discharged to home in stable condition  Odie Sera Jareth Pardee 09/18/2021, 6:32 PM

## 2021-09-18 NOTE — MAU Note (Signed)
Christie Fowler is a 21 y.o. at [redacted]w[redacted]d here in MAU reporting: yesterday was feeling very weak and nauseous.  Was having pain in mid to low back, "where her kidneys are".  Woke up today, pain was spreading down legs to her feet. Threw up and hour ago, and that is whne it got really bad. Now she hurts all over.  Denies diarrhea or constipation. No problems with urination.  Denies fever, vag bleeding or LOF. Reports +FM Onset of complaint: yesterday, got worse about an hour ago Pain score: body:10, now 7/8. Back 9 Vitals:   09/18/21 1708  BP: 114/65  Pulse: 100  Resp: 16  Temp: 98.6 F (37 C)  SpO2: 100%     FHT:142 Lab orders placed from triage:  urine

## 2021-09-18 NOTE — Procedures (Signed)
Christie Fowler 03/15/2000 [redacted]w[redacted]d  Fetus A Non-Stress Test Interpretation for 09/18/21  Indication: Unsatisfactory BPP, IUGR  Fetal Heart Rate A Mode: External Baseline Rate (A): 140 bpm Variability: Moderate Accelerations: 15 x 15 Decelerations: None Multiple birth?: No  Uterine Activity Mode: Palpation, Toco Contraction Frequency (min): none Resting Tone Palpated: Relaxed  Interpretation (Fetal Testing) Nonstress Test Interpretation: Reactive Overall Impression: Reassuring for gestational age Comments: Dr. Parke Poisson reviewed tracing

## 2021-09-21 LAB — CULTURE, OB URINE: Culture: 80000 — AB

## 2021-09-22 ENCOUNTER — Other Ambulatory Visit: Payer: Self-pay

## 2021-09-22 DIAGNOSIS — O2343 Unspecified infection of urinary tract in pregnancy, third trimester: Secondary | ICD-10-CM

## 2021-09-22 MED ORDER — CEFADROXIL 500 MG PO CAPS: 500.0000 mg | ORAL_CAPSULE | Freq: Two times a day (BID) | ORAL | 0 refills | Status: AC

## 2021-09-26 ENCOUNTER — Encounter: Payer: Self-pay | Admitting: *Deleted

## 2021-09-26 ENCOUNTER — Other Ambulatory Visit: Payer: Self-pay | Admitting: *Deleted

## 2021-09-26 ENCOUNTER — Ambulatory Visit: Payer: Medicaid Other | Attending: Obstetrics

## 2021-09-26 ENCOUNTER — Ambulatory Visit: Payer: Medicaid Other | Admitting: *Deleted

## 2021-09-26 ENCOUNTER — Ambulatory Visit: Payer: Medicaid Other | Attending: Maternal & Fetal Medicine | Admitting: Maternal & Fetal Medicine

## 2021-09-26 VITALS — BP 115/62 | HR 74

## 2021-09-26 DIAGNOSIS — O09293 Supervision of pregnancy with other poor reproductive or obstetric history, third trimester: Secondary | ICD-10-CM

## 2021-09-26 DIAGNOSIS — O365930 Maternal care for other known or suspected poor fetal growth, third trimester, not applicable or unspecified: Secondary | ICD-10-CM | POA: Insufficient documentation

## 2021-09-26 DIAGNOSIS — O4443 Low lying placenta NOS or without hemorrhage, third trimester: Secondary | ICD-10-CM | POA: Diagnosis not present

## 2021-09-26 DIAGNOSIS — O4442 Low lying placenta NOS or without hemorrhage, second trimester: Secondary | ICD-10-CM | POA: Insufficient documentation

## 2021-09-26 DIAGNOSIS — O0990 Supervision of high risk pregnancy, unspecified, unspecified trimester: Secondary | ICD-10-CM

## 2021-09-26 DIAGNOSIS — O34219 Maternal care for unspecified type scar from previous cesarean delivery: Secondary | ICD-10-CM

## 2021-09-26 DIAGNOSIS — Z3A35 35 weeks gestation of pregnancy: Secondary | ICD-10-CM

## 2021-09-26 DIAGNOSIS — O365931 Maternal care for other known or suspected poor fetal growth, third trimester, fetus 1: Secondary | ICD-10-CM

## 2021-09-26 NOTE — Progress Notes (Signed)
MFM Consult Note Patient Name: Christie Fowler  Patient MRN:   [id number]  Referring provider: [facility]  Reason for Consult: FGR with newly identified elevated UA dopplers   HPI: Shlonda Colligan is a 21 y.o. G2P1001 at [redacted]w[redacted]d.   Review of Systems: A review of systems was performed and was negative except per HPI   Vitals Blood pressure: 115/62, Pulse: 74, Prepregnancy BMI: 18.43 Genetic testing: declined.   Counseling  Today her ultrasound revealed elevated umbilical artery Dopplers but no absent or reversed flow. BPP was 8/8.  I counseled her about the clinical significance of the Doppler findings. I discussed the various causes of growth restriction including constitutional, placental insufficiency, genetic problems and chronic maternal disease. Currently there is no evidence sonographic stigmata suggesting infection or aneuploidy.  At this time it is likely that either a constitutionally small fetus or placental insufficiency is the underlying cause of her growth restriction.  I also discussed the importance of antenatal fetal surveillance including antenatal testing and umbilical artery Doppler assessment to reduce the risk of stillbirth.  I discussed the management going forward in the pregnancy with potential alteration in the timing of delivery. Currently she feels well and denies headache, vision changes, right upper quadrant pain, contractions, vaginal bleeding or loss of fluid.  She reports good fetal movement.   Assessment/Plan FGR - Continue weekly UA Dopplers and BPPs. - Continue serial growth ultrasounds every 3 weeks until delivery. - Delivery timing will depend on future antenatal testing and UA dopplers, but at this time she should be delivered around [redacted]w[redacted]d to [redacted]w[redacted]d because of her elevated UA dopplers.  - Fetal aneuploidy screening was recommended and was declined  I spent 20 minutes reviewing the patients chart, including labs and images as well as counseling the patient about her  medical conditions.  Braxton Feathers  MFM, [facility] [place]   09/26/2021  1:29 PM

## 2021-09-27 ENCOUNTER — Ambulatory Visit (INDEPENDENT_AMBULATORY_CARE_PROVIDER_SITE_OTHER): Payer: Medicaid Other | Admitting: Obstetrics & Gynecology

## 2021-09-27 ENCOUNTER — Encounter: Payer: Self-pay | Admitting: Obstetrics & Gynecology

## 2021-09-27 VITALS — BP 94/58 | HR 88 | Wt 120.0 lb

## 2021-09-27 DIAGNOSIS — Z3A35 35 weeks gestation of pregnancy: Secondary | ICD-10-CM

## 2021-09-27 DIAGNOSIS — O0990 Supervision of high risk pregnancy, unspecified, unspecified trimester: Secondary | ICD-10-CM

## 2021-09-27 DIAGNOSIS — O0993 Supervision of high risk pregnancy, unspecified, third trimester: Secondary | ICD-10-CM

## 2021-09-27 DIAGNOSIS — O365930 Maternal care for other known or suspected poor fetal growth, third trimester, not applicable or unspecified: Secondary | ICD-10-CM

## 2021-09-27 DIAGNOSIS — Z98891 History of uterine scar from previous surgery: Secondary | ICD-10-CM

## 2021-09-27 DIAGNOSIS — O09299 Supervision of pregnancy with other poor reproductive or obstetric history, unspecified trimester: Secondary | ICD-10-CM

## 2021-09-27 DIAGNOSIS — O09293 Supervision of pregnancy with other poor reproductive or obstetric history, third trimester: Secondary | ICD-10-CM

## 2021-09-27 NOTE — Progress Notes (Signed)
   PRENATAL VISIT NOTE  Subjective:  Christie Fowler is a 21 y.o. G2P1001 at [redacted]w[redacted]d being seen today for ongoing prenatal care.  She is currently monitored for the following issues for this high-risk pregnancy and has MDD (major depressive disorder), recurrent severe, without psychosis (HCC); Suicidal ideation; Tylenol ingestion; Supervision of high risk pregnancy, antepartum; Hx of preeclampsia, prior pregnancy, currently pregnant; History of cesarean section; Thrombocytopenia affecting pregnancy (HCC); Anemia affecting pregnancy in third trimester; Back pain in pregnancy; and Poor fetal growth affecting management of mother in third trimester on their problem list.  Patient reports no complaints.  Contractions: Irritability. Vag. Bleeding: None.  Movement: Present. Denies leaking of fluid.   The following portions of the patient's history were reviewed and updated as appropriate: allergies, current medications, past family history, past medical history, past social history, past surgical history and problem list.   Objective:   Vitals:   09/27/21 1122  BP: (!) 94/58  Pulse: 88  Weight: 120 lb (54.4 kg)    Fetal Status: Fetal Heart Rate (bpm): 140   Movement: Present     General:  Alert, oriented and cooperative. Patient is in no acute distress.  Skin: Skin is warm and dry. No rash noted.   Cardiovascular: Normal heart rate noted  Respiratory: Normal respiratory effort, no problems with respiration noted  Abdomen: Soft, gravid, appropriate for gestational age.  Pain/Pressure: Absent     Pelvic: Cervical exam deferred        Extremities: Normal range of motion.     Mental Status: Normal mood and affect. Normal behavior. Normal judgment and thought content.   Assessment and Plan:  Pregnancy: G2P1001 at [redacted]w[redacted]d 1. Supervision of high risk pregnancy, antepartum 1 week f/u, plan to delivery by 38 weeks currently  2. History of cesarean section Plans TOLAC  3. Hx of preeclampsia, prior  pregnancy, currently pregnant Nl BP  4. Poor fetal growth affecting management of mother in third trimester, single or unspecified fetus F/u US scheduled  Preterm labor symptoms and general obstetric precautions including but not limited to vaginal bleeding, contractions, leaking of fluid and fetal movement were reviewed in detail with the patient. Please refer to After Visit Summary for other counseling recommendations.   No follow-ups on file.  Future Appointments  Date Time Provider Department Center  09/28/2021 11:00 AM Shanna Cisco, NP [facility] None  10/02/2021 10:30 AM [facility] NURSE [facility]  10/02/2021 10:45 AM [facility] US4 [facility]  10/10/2021 10:45 AM [facility] NURSE [facility]  10/10/2021 11:00 AM [facility] US7 [facility]    Scheryl Darter, MD

## 2021-09-28 ENCOUNTER — Telehealth (HOSPITAL_COMMUNITY): Payer: Medicaid Other | Admitting: Psychiatry

## 2021-09-28 ENCOUNTER — Encounter (HOSPITAL_COMMUNITY): Payer: Self-pay

## 2021-10-02 ENCOUNTER — Other Ambulatory Visit (HOSPITAL_COMMUNITY)
Admission: RE | Admit: 2021-10-02 | Discharge: 2021-10-02 | Disposition: A | Discharge status: Home / Self Care | Payer: Medicaid Other | Source: Ambulatory Visit | Attending: Obstetrics and Gynecology | Admitting: Obstetrics and Gynecology

## 2021-10-02 ENCOUNTER — Ambulatory Visit: Payer: Medicaid Other | Attending: Obstetrics

## 2021-10-02 ENCOUNTER — Ambulatory Visit: Payer: Medicaid Other | Admitting: *Deleted

## 2021-10-02 ENCOUNTER — Encounter: Payer: Self-pay | Admitting: Obstetrics and Gynecology

## 2021-10-02 ENCOUNTER — Ambulatory Visit (INDEPENDENT_AMBULATORY_CARE_PROVIDER_SITE_OTHER): Payer: Medicaid Other | Admitting: Obstetrics and Gynecology

## 2021-10-02 VITALS — BP 116/70 | HR 66 | Wt 119.0 lb

## 2021-10-02 DIAGNOSIS — O99013 Anemia complicating pregnancy, third trimester: Secondary | ICD-10-CM

## 2021-10-02 DIAGNOSIS — O0990 Supervision of high risk pregnancy, unspecified, unspecified trimester: Secondary | ICD-10-CM | POA: Insufficient documentation

## 2021-10-02 DIAGNOSIS — O09293 Supervision of pregnancy with other poor reproductive or obstetric history, third trimester: Secondary | ICD-10-CM

## 2021-10-02 DIAGNOSIS — O365930 Maternal care for other known or suspected poor fetal growth, third trimester, not applicable or unspecified: Secondary | ICD-10-CM

## 2021-10-02 DIAGNOSIS — O4443 Low lying placenta NOS or without hemorrhage, third trimester: Secondary | ICD-10-CM

## 2021-10-02 DIAGNOSIS — Z3A35 35 weeks gestation of pregnancy: Secondary | ICD-10-CM | POA: Diagnosis not present

## 2021-10-02 DIAGNOSIS — O34219 Maternal care for unspecified type scar from previous cesarean delivery: Secondary | ICD-10-CM | POA: Diagnosis not present

## 2021-10-02 DIAGNOSIS — O09299 Supervision of pregnancy with other poor reproductive or obstetric history, unspecified trimester: Secondary | ICD-10-CM

## 2021-10-02 DIAGNOSIS — O0993 Supervision of high risk pregnancy, unspecified, third trimester: Secondary | ICD-10-CM

## 2021-10-02 NOTE — Progress Notes (Signed)
   PRENATAL VISIT NOTE  Subjective:  Christie Fowler is a 21 y.o. G3P1001 at [redacted]w[redacted]d being seen today for ongoing prenatal care.  She is currently monitored for the following issues for this high-risk pregnancy and has MDD (major depressive disorder), recurrent severe, without psychosis (HCC); Suicidal ideation; Tylenol ingestion; Supervision of high risk pregnancy, antepartum; Hx of preeclampsia, prior pregnancy, currently pregnant; History of cesarean section; Thrombocytopenia affecting pregnancy (HCC); Anemia affecting pregnancy in third trimester; Back pain in pregnancy; and Poor fetal growth affecting management of mother in third trimester on their problem list.  Patient reports no complaints.  Contractions: Irregular. Vag. Bleeding: None.  Movement: Present. Denies leaking of fluid.   The following portions of the patient's history were reviewed and updated as appropriate: allergies, current medications, past family history, past medical history, past social history, past surgical history and problem list.   Objective:   Vitals:   10/02/21 1622  BP: 116/70  Pulse: 66  Weight: 119 lb (54 kg)    Fetal Status: Fetal Heart Rate (bpm): 135 Fundal Height: 35 cm Movement: Present     General:  Alert, oriented and cooperative. Patient is in no acute distress.  Skin: Skin is warm and dry. No rash noted.   Cardiovascular: Normal heart rate noted  Respiratory: Normal respiratory effort, no problems with respiration noted  Abdomen: Soft, gravid, appropriate for gestational age.  Pain/Pressure: Present     Pelvic: Cervical exam performed in the presence of a chaperone Dilation: Closed Effacement (%): Thick Station: Ballotable  Extremities: Normal range of motion.     Mental Status: Normal mood and affect. Normal behavior. Normal judgment and thought content.   Assessment and Plan:  Pregnancy: G3P1001 at [redacted]w[redacted]d 1. Supervision of high risk pregnancy, antepartum Patient is doing well without  complaints Cultures today - Culture, beta strep (group b only) - Cervicovaginal ancillary only( [place]) - CBC; Standing - Type and screen; Standing - RPR; Standing  2. Poor fetal growth affecting management of mother in third trimester, single or unspecified fetus IOL recommended by MFM- orders in EPIC Follow up ultrasound on 9/5   3. Anemia affecting pregnancy in third trimester   4. Hx of preeclampsia, prior pregnancy, currently pregnant Normotensive and asymptomatic  Preterm labor symptoms and general obstetric precautions including but not limited to vaginal bleeding, contractions, leaking of fluid and fetal movement were reviewed in detail with the patient. Please refer to After Visit Summary for other counseling recommendations.   No follow-ups on file.  Future Appointments  Date Time Provider Department Center  10/10/2021 10:45 AM [facility] NURSE [facility]  10/10/2021 11:00 AM [facility] US7 [facility]    Catalina Antigua, MD

## 2021-10-03 LAB — CERVICOVAGINAL ANCILLARY ONLY
Chlamydia: NEGATIVE
Comment: NEGATIVE
Comment: NORMAL
Neisseria Gonorrhea: NEGATIVE

## 2021-10-04 ENCOUNTER — Telehealth (HOSPITAL_COMMUNITY): Payer: Self-pay | Admitting: *Deleted

## 2021-10-04 ENCOUNTER — Encounter (HOSPITAL_COMMUNITY): Payer: Self-pay

## 2021-10-04 ENCOUNTER — Encounter (HOSPITAL_COMMUNITY): Payer: Self-pay | Admitting: *Deleted

## 2021-10-04 NOTE — Telephone Encounter (Signed)
Preadmission screen  

## 2021-10-06 DIAGNOSIS — Z419 Encounter for procedure for purposes other than remedying health state, unspecified: Secondary | ICD-10-CM | POA: Diagnosis not present

## 2021-10-06 LAB — CULTURE, BETA STREP (GROUP B ONLY): Strep Gp B Culture: NEGATIVE

## 2021-10-07 ENCOUNTER — Other Ambulatory Visit: Payer: Self-pay | Admitting: Advanced Practice Midwife

## 2021-10-10 ENCOUNTER — Encounter (HOSPITAL_COMMUNITY): Payer: Self-pay | Admitting: Obstetrics and Gynecology

## 2021-10-10 ENCOUNTER — Other Ambulatory Visit: Payer: Self-pay

## 2021-10-10 ENCOUNTER — Inpatient Hospital Stay (HOSPITAL_COMMUNITY)
Admission: AD | Admit: 2021-10-10 | Discharge: 2021-10-13 | DRG: 806 | Disposition: A | Discharge status: Home / Self Care | Payer: Medicaid Other | Attending: Obstetrics & Gynecology | Admitting: Obstetrics & Gynecology

## 2021-10-10 ENCOUNTER — Ambulatory Visit (HOSPITAL_BASED_OUTPATIENT_CLINIC_OR_DEPARTMENT_OTHER): Payer: Medicaid Other

## 2021-10-10 ENCOUNTER — Ambulatory Visit: Payer: Medicaid Other | Admitting: *Deleted

## 2021-10-10 DIAGNOSIS — O99119 Other diseases of the blood and blood-forming organs and certain disorders involving the immune mechanism complicating pregnancy, unspecified trimester: Secondary | ICD-10-CM | POA: Diagnosis present

## 2021-10-10 DIAGNOSIS — Z98891 History of uterine scar from previous surgery: Secondary | ICD-10-CM

## 2021-10-10 DIAGNOSIS — Z7982 Long term (current) use of aspirin: Secondary | ICD-10-CM | POA: Diagnosis not present

## 2021-10-10 DIAGNOSIS — Z3A37 37 weeks gestation of pregnancy: Secondary | ICD-10-CM

## 2021-10-10 DIAGNOSIS — O365931 Maternal care for other known or suspected poor fetal growth, third trimester, fetus 1: Secondary | ICD-10-CM | POA: Insufficient documentation

## 2021-10-10 DIAGNOSIS — O365930 Maternal care for other known or suspected poor fetal growth, third trimester, not applicable or unspecified: Principal | ICD-10-CM | POA: Diagnosis present

## 2021-10-10 DIAGNOSIS — O0990 Supervision of high risk pregnancy, unspecified, unspecified trimester: Secondary | ICD-10-CM

## 2021-10-10 DIAGNOSIS — Z23 Encounter for immunization: Secondary | ICD-10-CM | POA: Diagnosis not present

## 2021-10-10 DIAGNOSIS — O9912 Other diseases of the blood and blood-forming organs and certain disorders involving the immune mechanism complicating childbirth: Secondary | ICD-10-CM | POA: Diagnosis not present

## 2021-10-10 DIAGNOSIS — O99013 Anemia complicating pregnancy, third trimester: Secondary | ICD-10-CM | POA: Diagnosis present

## 2021-10-10 DIAGNOSIS — Z30017 Encounter for initial prescription of implantable subdermal contraceptive: Secondary | ICD-10-CM | POA: Diagnosis not present

## 2021-10-10 DIAGNOSIS — O9902 Anemia complicating childbirth: Secondary | ICD-10-CM | POA: Diagnosis present

## 2021-10-10 DIAGNOSIS — D696 Thrombocytopenia, unspecified: Secondary | ICD-10-CM | POA: Diagnosis present

## 2021-10-10 DIAGNOSIS — O34219 Maternal care for unspecified type scar from previous cesarean delivery: Secondary | ICD-10-CM | POA: Diagnosis not present

## 2021-10-10 DIAGNOSIS — O34211 Maternal care for low transverse scar from previous cesarean delivery: Secondary | ICD-10-CM | POA: Diagnosis not present

## 2021-10-10 DIAGNOSIS — O164 Unspecified maternal hypertension, complicating childbirth: Secondary | ICD-10-CM | POA: Diagnosis not present

## 2021-10-10 DIAGNOSIS — D649 Anemia, unspecified: Secondary | ICD-10-CM | POA: Diagnosis not present

## 2021-10-10 DIAGNOSIS — T391X1A Poisoning by 4-Aminophenol derivatives, accidental (unintentional), initial encounter: Secondary | ICD-10-CM | POA: Diagnosis present

## 2021-10-10 DIAGNOSIS — O09299 Supervision of pregnancy with other poor reproductive or obstetric history, unspecified trimester: Secondary | ICD-10-CM

## 2021-10-10 DIAGNOSIS — Z3490 Encounter for supervision of normal pregnancy, unspecified, unspecified trimester: Principal | ICD-10-CM | POA: Diagnosis present

## 2021-10-10 LAB — CBC
HCT: 36.7 % (ref 36.0–46.0)
Hemoglobin: 12.6 g/dL (ref 12.0–15.0)
MCH: 31.2 pg (ref 26.0–34.0)
MCHC: 34.3 g/dL (ref 30.0–36.0)
MCV: 90.8 fL (ref 80.0–100.0)
Platelets: 122 10*3/uL — ABNORMAL LOW (ref 150–400)
RBC: 4.04 MIL/uL (ref 3.87–5.11)
RDW: 13.3 % (ref 11.5–15.5)
WBC: 7.4 10*3/uL (ref 4.0–10.5)
nRBC: 0 % (ref 0.0–0.2)

## 2021-10-10 LAB — TYPE AND SCREEN
ABO/RH(D): A POS
Antibody Screen: NEGATIVE

## 2021-10-10 MED ORDER — LACTATED RINGERS IV SOLN: INTRAVENOUS | Status: DC

## 2021-10-10 MED ORDER — LIDOCAINE HCL (PF) 1 % IJ SOLN: 30.0000 mL | INTRAMUSCULAR | Status: DC | PRN

## 2021-10-10 MED ORDER — LACTATED RINGERS IV SOLN
500.0000 mL | INTRAVENOUS | Status: DC | PRN
  Administered 2021-10-10: 500 mL via INTRAVENOUS

## 2021-10-10 MED ORDER — ACETAMINOPHEN 325 MG PO TABS
650.0000 mg | ORAL_TABLET | ORAL | Status: DC | PRN
  Administered 2021-10-12: 650 mg via ORAL
  Filled 2021-10-10: qty 2

## 2021-10-10 MED ORDER — TERBUTALINE SULFATE 1 MG/ML IJ SOLN: 0.2500 mg | Freq: Once | INTRAMUSCULAR | Status: DC | PRN

## 2021-10-10 MED ORDER — OXYTOCIN BOLUS FROM INFUSION
333.0000 mL | Freq: Once | INTRAVENOUS | Status: AC
  Administered 2021-10-12: 333 mL via INTRAVENOUS

## 2021-10-10 MED ORDER — ONDANSETRON HCL 4 MG/2ML IJ SOLN
4.0000 mg | Freq: Four times a day (QID) | INTRAMUSCULAR | Status: DC | PRN
  Administered 2021-10-11 (×2): 4 mg via INTRAVENOUS
  Filled 2021-10-10 (×2): qty 2

## 2021-10-10 MED ORDER — OXYTOCIN-SODIUM CHLORIDE 30-0.9 UT/500ML-% IV SOLN
1.0000 m[IU]/min | INTRAVENOUS | Status: DC
  Administered 2021-10-10: 1 m[IU]/min via INTRAVENOUS

## 2021-10-10 MED ORDER — OXYTOCIN-SODIUM CHLORIDE 30-0.9 UT/500ML-% IV SOLN
2.5000 [IU]/h | INTRAVENOUS | Status: DC
  Administered 2021-10-12: 2.5 [IU]/h via INTRAVENOUS
  Filled 2021-10-10 (×2): qty 500

## 2021-10-10 MED ORDER — OXYCODONE-ACETAMINOPHEN 5-325 MG PO TABS: 2.0000 | ORAL_TABLET | ORAL | Status: DC | PRN

## 2021-10-10 MED ORDER — OXYCODONE-ACETAMINOPHEN 5-325 MG PO TABS: 1.0000 | ORAL_TABLET | ORAL | Status: DC | PRN

## 2021-10-10 MED ORDER — SOD CITRATE-CITRIC ACID 500-334 MG/5ML PO SOLN: 30.0000 mL | ORAL | Status: DC | PRN

## 2021-10-10 NOTE — H&P (Addendum)
OBSTETRIC ADMISSION HISTORY AND PHYSICAL  Christie Fowler is a 21 y.o. female G2P1001 with IUP at [redacted]w[redacted]d by LMP = [redacted]w[redacted]d [place] presenting for IOL due to IUGR and TOLAC. She reports +FMs, No LOF, no VB, no blurry vision, headaches or peripheral edema, and RUQ pain.  She plans on breast feeding. She request nexplanon for birth control. She received her prenatal care at  [facility]    Dating: By LMP = [redacted]w[redacted]d [place] --->  Estimated Date of Delivery: 10/31/21  Sono:    @[redacted]w[redacted]d , CWD, normal anatomy, cephalic presentation, 2437g, EFW with AC 4%.    Prenatal History/Complications: Thrombocytopenia affecting pregnancy, FGR with initial elevated dopplers, now normal.   Past Medical History: Past Medical History:  Diagnosis Date   Hypertension    Pregnancy induced hypertension     Past Surgical History: Past Surgical History:  Procedure Laterality Date   CESAREAN SECTION N/A 02/01/2020   Procedure: CESAREAN SECTION;  Surgeon: 02/03/2020, MD;  Location: [facility] LD ORS;  Service: Obstetrics;  Laterality: N/A;   NO PAST SURGERIES      Obstetrical History: OB History     Gravida  2   Para  1   Term  1   Preterm      AB      Living  1      SAB      IAB      Ectopic      Multiple  0   Live Births  1           Social History Social History   Socioeconomic History   Marital status: Married    Spouse name: Lavina Hamman   Number of children: 1   Years of education: Not on file   Highest education level: Not on file  Occupational History   Not on file  Tobacco Use   Smoking status: Never   Smokeless tobacco: Never  Vaping Use   Vaping Use: Never used  Substance and Sexual Activity   Alcohol use: Never   Drug use: Never   Sexual activity: Yes    Birth control/protection: Implant    Comment: Nexplanon  Other Topics Concern   Not on file  Social History Narrative   Not on file   Social Determinants of Health   Financial Resource Strain: Not on file  Food Insecurity:  No Food Insecurity (10/10/2021)   Hunger Vital Sign    Worried About Running Out of Food in the Last Year: Never true    Ran Out of Food in the Last Year: Never true  Transportation Needs: No Transportation Needs (10/10/2021)   PRAPARE - 12/10/2021 (Medical): No    Lack of Transportation (Non-Medical): No  Physical Activity: Not on file  Stress: Not on file  Social Connections: Not on file    Family History: Family History  Problem Relation Age of Onset   Hypertension Father    Diabetes Paternal Grandmother     Allergies: No Known Allergies  Medications Prior to Admission  Medication Sig Dispense Refill Last Dose   aspirin EC 81 MG tablet Take 1 tablet (81 mg total) by mouth daily. Swallow whole. 30 tablet 11 Past Month   cyclobenzaprine (FLEXERIL) 5 MG tablet Take 1 tablet (5 mg total) by mouth 3 (three) times daily as needed for muscle spasms. 15 tablet 0 Past Week   ferrous sulfate 325 (65 FE) MG tablet Take 1 tablet (325 mg total) by mouth  every other day. 30 tablet 3 Past Week   Prenatal Vit-Fe Fumarate-FA (PRENATAL MULTIVITAMIN) TABS tablet Take 1 tablet by mouth daily at 12 noon.   Past Month   acetaminophen (TYLENOL) 500 MG tablet Take 2 tablets (1,000 mg total) by mouth every 8 (eight) hours. (Patient not taking: Reported on 07/12/2021) 60 tablet 1    Doxylamine-Pyridoxine (DICLEGIS) 10-10 MG TBEC Take 2 tablets by mouth daily. The dose may be increased to four tablets orally over the course of the day, as needed, for more severe nausea (two tablets at bedtime and add one tablet midmorning and one tablet in midafternoon if needed) (Patient not taking: Reported on 06/01/2021) 60 tablet 0    nitrofurantoin, macrocrystal-monohydrate, (MACROBID) 100 MG capsule Take 1 capsule (100 mg total) by mouth 2 (two) times daily. (Patient not taking: Reported on 10/10/2021) 14 capsule 0 Not Taking   sertraline (ZOLOFT) 25 MG tablet Take 1 tablet (25 mg total) by mouth  daily. (Patient not taking: Reported on 08/09/2021) 30 tablet 3      Review of Systems   All systems reviewed and negative except as stated in HPI  Last menstrual period 01/24/2021, unknown if currently breastfeeding. General appearance: alert, cooperative, and appears stated age Lungs: clear to auscultation bilaterally Heart: regular rate and rhythm Abdomen: soft, non-tender; bowel sounds normal Pelvic: No abnormalities Extremities: Homans sign is negative, no sign of DVT DTR's intact Presentation: cephalic Fetal monitoringBaseline: 145 bpm, Variability: Good {> 6 bpm), Accelerations: Reactive, and Decelerations: Absent Uterine activityFrequency: Every 3-4 minutes     Prenatal labs: ABO, Rh: A/Positive/-- (02/16 1528) Antibody: Negative (02/16 1528) Rubella: 3.94 (02/16 1528) RPR: Non Reactive (07/05 1014)  HBsAg: Negative (02/16 1528)  HIV: Non Reactive (07/05 1014)  GBS: Negative/-- (08/28 1641)  1 hr Glucola passed (75) Genetic screening  Declined all Anatomy US appears normal  Prenatal Transfer Tool  Maternal Diabetes: No Genetic Screening: Declined Maternal Ultrasounds/Referrals: IUGR Fetal Ultrasounds or other Referrals:  Referred to Materal Fetal Medicine  Maternal Substance Abuse:  No Significant Maternal Medications:  None Significant Maternal Lab Results:  Group B Strep negative Number of Prenatal Visits:greater than 3 verified prenatal visits Other Comments:  None  No results found for this or any previous visit (from the past 24 hour(s)).  Patient Active Problem List   Diagnosis Date Noted   Encounter for induction of labor 10/10/2021   Poor fetal growth affecting management of mother in third trimester 09/27/2021   Thrombocytopenia affecting pregnancy (HCC) 09/18/2021   Anemia affecting pregnancy in third trimester 09/18/2021   Back pain in pregnancy 09/18/2021   Hx of preeclampsia, prior pregnancy, currently pregnant 05/04/2021   History of cesarean  section 05/04/2021   Supervision of high risk pregnancy, antepartum 03/23/2021   MDD (major depressive disorder), recurrent severe, without psychosis (HCC) 03/09/2021   Suicidal ideation 03/09/2021   Tylenol ingestion 03/09/2021    Assessment/Plan:  Christie Fowler is a 21 y.o. G2P1001 at [redacted]w[redacted]d here for IOL due to IUGR and TOLAC  #Labor: Discussed methods of induction with previous c-section. Patient agreed to start pitocin. Will start 1x1. Will place foley balloon when cervix is amenable.  #Pain: Family support.  #FWB: Cat I tracing #ID:  GBS (-) #MOF: Breast #MOC: Nexplanon #Circ:  N/A #Thrombocytopenia: Platelets 122 on 10/10/21. Will continue to monitor for excess bleeding. Currently no signs of pre-eclampsia.  #IUGR: EFW 16%ile, AC 4%ile. UD dopplers: normal on 10/10/21.   Moody Bruins, MD  Resident Physician 10/10/2021,  8:43 PM   Attestation of Supervision of Resident:  I confirm that I have verified the information documented in the  resident's  note and that I have also personally reperformed the history, physical exam and all medical decision making activities.  I have verified that all services and findings are accurately documented in this student's note; and I agree with management and plan as outlined in the documentation. I have also made any necessary editorial changes.  FB Procedure Attempt: Patient informed of R/B/A of procedure.  Patient has been on the monitor with a reactive tracing as of the start of the procedure.   Procedure done to begin ripening of the cervix for induction of labor. Appropriate time out taken. The patient was placed in the lithotomy position and the cervix brought into view with sterile speculum. A ring forcep was used to guide the 22F foley", Cook Catheter through the internal os of the cervix. However, the cervix was closed and posterior and the FB would not thread through the cervical os. Attempted again without speculum and by just SVE, but  still unable to place FB.   Will start pitocin at this time and reassess in 2-4 hours to see if FB can be placed.   Thressa Sheller DNP, CNM  10/10/21  10:28 PM

## 2021-10-11 ENCOUNTER — Inpatient Hospital Stay (HOSPITAL_COMMUNITY): Payer: Medicaid Other

## 2021-10-11 ENCOUNTER — Inpatient Hospital Stay (HOSPITAL_COMMUNITY): Payer: Medicaid Other | Admitting: Anesthesiology

## 2021-10-11 LAB — RPR: RPR Ser Ql: NONREACTIVE

## 2021-10-11 LAB — CBC
HCT: 30.4 % — ABNORMAL LOW (ref 36.0–46.0)
Hemoglobin: 10.4 g/dL — ABNORMAL LOW (ref 12.0–15.0)
MCH: 30.5 pg (ref 26.0–34.0)
MCHC: 34.2 g/dL (ref 30.0–36.0)
MCV: 89.1 fL (ref 80.0–100.0)
Platelets: 100 10*3/uL — ABNORMAL LOW (ref 150–400)
RBC: 3.41 MIL/uL — ABNORMAL LOW (ref 3.87–5.11)
RDW: 13.2 % (ref 11.5–15.5)
WBC: 7.5 10*3/uL (ref 4.0–10.5)
nRBC: 0 % (ref 0.0–0.2)

## 2021-10-11 MED ORDER — FENTANYL CITRATE (PF) 100 MCG/2ML IJ SOLN
100.0000 ug | INTRAMUSCULAR | Status: DC | PRN
  Administered 2021-10-11 (×2): 100 ug via INTRAVENOUS
  Filled 2021-10-11 (×2): qty 2

## 2021-10-11 MED ORDER — EPHEDRINE 5 MG/ML INJ: 10.0000 mg | INTRAVENOUS | Status: DC | PRN

## 2021-10-11 MED ORDER — ZOLPIDEM TARTRATE 5 MG PO TABS
5.0000 mg | ORAL_TABLET | Freq: Every evening | ORAL | Status: DC | PRN
  Administered 2021-10-11: 5 mg via ORAL
  Filled 2021-10-11: qty 1

## 2021-10-11 MED ORDER — PHENYLEPHRINE 80 MCG/ML (10ML) SYRINGE FOR IV PUSH (FOR BLOOD PRESSURE SUPPORT): 80.0000 ug | PREFILLED_SYRINGE | INTRAVENOUS | Status: DC | PRN

## 2021-10-11 MED ORDER — LIDOCAINE HCL (PF) 1 % IJ SOLN
INTRAMUSCULAR | Status: DC | PRN
  Administered 2021-10-11 (×2): 5 mL via EPIDURAL

## 2021-10-11 MED ORDER — FENTANYL-BUPIVACAINE-NACL 0.5-0.125-0.9 MG/250ML-% EP SOLN
12.0000 mL/h | EPIDURAL | Status: DC | PRN
  Administered 2021-10-11: 12 mL/h via EPIDURAL
  Filled 2021-10-11: qty 250

## 2021-10-11 MED ORDER — OXYTOCIN-SODIUM CHLORIDE 30-0.9 UT/500ML-% IV SOLN: 1.0000 m[IU]/min | INTRAVENOUS | Status: DC

## 2021-10-11 MED ORDER — LACTATED RINGERS IV SOLN
500.0000 mL | Freq: Once | INTRAVENOUS | Status: AC
  Administered 2021-10-11: 500 mL via INTRAVENOUS

## 2021-10-11 MED ORDER — DIPHENHYDRAMINE HCL 50 MG/ML IJ SOLN
12.5000 mg | INTRAMUSCULAR | Status: DC | PRN
  Administered 2021-10-11: 12.5 mg via INTRAVENOUS
  Filled 2021-10-11: qty 1

## 2021-10-11 MED ORDER — LACTATED RINGERS IV SOLN: 500.0000 mL | Freq: Once | INTRAVENOUS | Status: DC

## 2021-10-11 NOTE — Anesthesia Procedure Notes (Signed)
Epidural Patient location during procedure: OB Start time: 10/11/2021 6:41 PM End time: 10/11/2021 6:50 PM  Staffing Anesthesiologist: Achille Rich, MD Performed: anesthesiologist   Preanesthetic Checklist Completed: patient identified, IV checked, site marked, risks and benefits discussed, monitors and equipment checked, pre-op evaluation and timeout performed  Epidural Patient position: sitting Prep: DuraPrep Patient monitoring: heart rate, cardiac monitor, continuous pulse ox and blood pressure Approach: midline Location: L2-L3 Injection technique: LOR saline  Needle:  Needle type: Tuohy  Needle gauge: 17 G Needle length: 9 cm Needle insertion depth: 5 cm Catheter type: closed end flexible Catheter size: 19 Gauge Catheter at skin depth: 11 cm Test dose: negative and Other  Assessment Events: blood not aspirated, injection not painful, no injection resistance and negative IV test  Additional Notes Informed consent obtained prior to proceeding including risk of failure, 1% risk of PDPH, risk of minor discomfort and bruising.  Discussed rare but serious complications including epidural abscess, permanent nerve injury, epidural hematoma.  Discussed alternatives to epidural analgesia and patient desires to proceed.  Timeout performed pre-procedure verifying patient name, procedure, and platelet count.  Patient tolerated procedure well. Reason for block:procedure for pain

## 2021-10-11 NOTE — Anesthesia Preprocedure Evaluation (Signed)
Anesthesia Evaluation  Patient identified by MRN, date of birth, ID band Patient awake    Reviewed: Allergy & Precautions, H&P , NPO status , Patient's Chart, lab work & pertinent test results  Airway Mallampati: II   Neck ROM: full    Dental   Pulmonary neg pulmonary ROS,    breath sounds clear to auscultation       Cardiovascular hypertension,  Rhythm:regular Rate:Normal     Neuro/Psych PSYCHIATRIC DISORDERS Depression    GI/Hepatic   Endo/Other    Renal/GU      Musculoskeletal   Abdominal   Peds  Hematology  (+) Blood dyscrasia, anemia , PLTS 100   Anesthesia Other Findings   Reproductive/Obstetrics (+) Pregnancy                             Anesthesia Physical Anesthesia Plan  ASA: 2  Anesthesia Plan: Epidural   Post-op Pain Management:    Induction: Intravenous  PONV Risk Score and Plan: 2 and Treatment may vary due to age or medical condition  Airway Management Planned: Natural Airway  Additional Equipment:   Intra-op Plan:   Post-operative Plan:   Informed Consent: I have reviewed the patients History and Physical, chart, labs and discussed the procedure including the risks, benefits and alternatives for the proposed anesthesia with the patient or authorized representative who has indicated his/her understanding and acceptance.     Dental advisory given  Plan Discussed with: CRNA, Anesthesiologist and Surgeon  Anesthesia Plan Comments:         Anesthesia Quick Evaluation

## 2021-10-11 NOTE — Progress Notes (Addendum)
Labor Progress Note Christie Fowler is a 21 y.o. G2P1001 at [redacted]w[redacted]d presented for iol iugr   S: Christie Fowler is resting comfortably. Epidural in place.   O:  BP (!) 97/58   Pulse 65   Temp 97.7 F (36.5 C) (Oral)   Resp 16   Ht 5\' 3"  (1.6 m)   Wt 54.4 kg   LMP 01/24/2021 (Exact Date)   SpO2 100%   BMI 21.26 kg/m  EFM: 125 bpm/Moderate variability/ 15x15 accels/ None decels   CVE: Dilation: 5.5 Effacement (%): 80 Cervical Position: Posterior Station: 0, -1 Presentation: Vertex Exam by:: [email or web address] RN   A&P: 21 y.o. G2P1001 [redacted]w[redacted]d IOL IUGR #Labor: Progressing well. Pitocin currently at 37mu/min. IUPC placed to monitor quality of contractions.  #Pain: Epidural in place.  #FWB: CAT 1 #GBS negative   Colter 18m, MD Resident Physician  7:59 PM  GME ATTESTATION:  Evaluation and management procedures were performed by the [facility] Resident under my supervision. I was immediately available for direct supervision, assistance and direction throughout this encounter.  I also confirm that I have verified the information documented in the resident's note, and that I have also personally reperformed the pertinent components of the physical exam and all of the medical decision making activities.  I have also made any necessary editorial changes.  [facility], DO OB Fellow, [facility], Center for [facility] 10/11/2021 8:20 PM

## 2021-10-11 NOTE — Progress Notes (Signed)
Labor Progress Note Ariyon Sires is a 21 y.o. G2P1001 at [redacted]w[redacted]d presented for iol iugr   S: pt feeling well. Contractions more intense  O:  BP 108/69   Pulse 82   Temp 98.1 F (36.7 C) (Oral)   Resp 15   Ht 5\' 3"  (1.6 m)   Wt 54.4 kg   LMP 01/24/2021 (Exact Date)   BMI 21.26 kg/m  EFM: 140 bpm/Moderate variability/ 15x15 accels/ None decels   CVE: Dilation: 4 Effacement (%): 70 Cervical Position: Posterior Station: -3 Presentation: Vertex Exam by:: h stone rnc   A&P: 20 y.o. G2P1001 [redacted]w[redacted]d IOL IUGR #Labor: Progressing well. FB out, pitocin increased. Pt encouraged to move about the room and L&D floor #Pain: epidural upon request, IV pain meds, support #FWB: CAT 1 #GBS negative   [redacted]w[redacted]d Mercado-Ortiz, DO 12:32 PM

## 2021-10-11 NOTE — Progress Notes (Signed)
Christie Fowler is a 21 y.o. G2P1001 at [redacted]w[redacted]d admitted for induction of labor due to Poor fetal growth.  Subjective:   Objective: BP (!) 91/50   Pulse 76   Temp 97.8 F (36.6 C) (Axillary)   Resp 12   Ht 5\' 3"  (1.6 m)   Wt 54.4 kg   LMP 01/24/2021 (Exact Date)   BMI 21.26 kg/m  No intake/output data recorded. No intake/output data recorded.  FHT:  FHR: 145 bpm, variability: moderate,  accelerations:  Present,  decelerations:  Absent UC:   regular, every 3-6 minutes SVE:   Dilation: 1 Effacement (%): 40 Station: Ballotable Exam by:: [email or web address] CNM  Labs: Lab Results  Component Value Date   WBC 7.4 10/10/2021   HGB 12.6 10/10/2021   HCT 36.7 10/10/2021   MCV 90.8 10/10/2021   PLT 122 (L) 10/10/2021    Assessment / Plan: Induction of labor due to IUGR,  progressing well on pitocin  Labor: Progressing normally and consider FB when more dilated  Preeclampsia:   NA Fetal Wellbeing:  Category I Pain Control:  Labor support without medications I/D:  n/a Anticipated MOD:   VBAC   12/10/2021 DNP, CNM  10/11/21  5:59 AM

## 2021-10-11 NOTE — Progress Notes (Addendum)
Christie Fowler is a 21 y.o. G2P1001 at [redacted]w[redacted]d by ultrasound admitted for induction of labor due to Poor fetal growth.  Subjective: Pt doing well.   Objective: BP 113/63   Pulse 77   Temp 97.8 F (36.6 C) (Axillary)   Resp 15   Ht 5\' 3"  (1.6 m)   Wt 54.4 kg   LMP 01/24/2021 (Exact Date)   BMI 21.26 kg/m  No intake/output data recorded. No intake/output data recorded.  FHT:  FHR: 140 bpm, variability: moderate,  accelerations:  Present,  decelerations:  Present some variable decelerations  UC:   regular, every 2 minutes SVE:   Dilation: 1 Effacement (%): 40 Station: Ballotable Exam by:: dr [email or web address]  Labs: Lab Results  Component Value Date   WBC 7.4 10/10/2021   HGB 12.6 10/10/2021   HCT 36.7 10/10/2021   MCV 90.8 10/10/2021   PLT 122 (L) 10/10/2021    Assessment / Plan: Induction of labor due to IUGR,  progressing well on pitocin  Labor: Progressing on Pitocin, will continue to increase then AROM and foley bulb placed at 0849  Fetal Wellbeing:  Category I Pain Control:  Labor support without medications I/D:   GBS negative  Anticipated MOD:  NSVD  12/10/2021, Medical Student 10/11/2021, 8:55 AM  ______________ 12/11/2021 of Supervision of Student:  I confirm that I have verified the information documented in the medical student's note and that I have also personally reperformed the history, physical exam and all medical decision making activities.  I have verified that all services and findings are accurately documented in this student's note; and I agree with management and plan as outlined in the documentation. I have also made any necessary editorial changes.  Flonnie Hailstone, DO Center for [facility], [facility] 10/11/2021 9:12 AM

## 2021-10-11 NOTE — Progress Notes (Addendum)
Labor Progress Note Christie Fowler is a 21 y.o. G2P1001 at [redacted]w[redacted]d presented for IOL due to IUGR and TOLAC.  S: Christie Fowler is doing well. In good spirits, starting to feel the contractions.   O:  BP 103/71   Pulse 81   Temp 98.3 F (36.8 C) (Oral)   Resp 16   Ht 5\' 3"  (1.6 m)   Wt 54.4 kg   LMP 01/24/2021 (Exact Date)   BMI 21.26 kg/m  EFM: 145/moderate variability/(+) acels, no decelerations  CVE: Dilation: Closed Effacement (%): Thick Station: Ballotable Presentation: Vertex Exam by:: [email or web address] CNM   A&P: 21 y.o. G2P1001 [redacted]w[redacted]d admitted to L&D for IOL due to IUGR and TOLAC.  #Labor: Progressing well. Pitocin currently at 64mu/min. Continue 1x1. Cervix still closed, will consider FB at next check.  #Pain: Spousal support #FWB: Cat I  #GBS negative   Colter 11m, MD Resident Physician 1:03 AM   Attestation of Supervision of Resident:  I confirm that I have verified the information documented in the  resident's  note and that I have also personally reperformed the history, physical exam and all medical decision making activities.  I have verified that all services and findings are accurately documented in this resident's note; and I agree with management and plan as outlined in the documentation. I have also made any necessary editorial changes.  Burgess Estelle DNP, CNM  10/11/21  1:52 AM

## 2021-10-12 ENCOUNTER — Encounter (HOSPITAL_COMMUNITY): Payer: Self-pay | Admitting: Obstetrics and Gynecology

## 2021-10-12 DIAGNOSIS — O9902 Anemia complicating childbirth: Secondary | ICD-10-CM | POA: Diagnosis not present

## 2021-10-12 DIAGNOSIS — O34219 Maternal care for unspecified type scar from previous cesarean delivery: Secondary | ICD-10-CM

## 2021-10-12 DIAGNOSIS — Z3A37 37 weeks gestation of pregnancy: Secondary | ICD-10-CM | POA: Diagnosis not present

## 2021-10-12 DIAGNOSIS — O34211 Maternal care for low transverse scar from previous cesarean delivery: Secondary | ICD-10-CM | POA: Diagnosis not present

## 2021-10-12 DIAGNOSIS — O365930 Maternal care for other known or suspected poor fetal growth, third trimester, not applicable or unspecified: Secondary | ICD-10-CM | POA: Diagnosis not present

## 2021-10-12 LAB — CBC WITH DIFFERENTIAL/PLATELET
Abs Immature Granulocytes: 0.06 10*3/uL (ref 0.00–0.07)
Basophils Absolute: 0 10*3/uL (ref 0.0–0.1)
Basophils Relative: 0 %
Eosinophils Absolute: 0 10*3/uL (ref 0.0–0.5)
Eosinophils Relative: 0 %
HCT: 30.5 % — ABNORMAL LOW (ref 36.0–46.0)
Hemoglobin: 10.3 g/dL — ABNORMAL LOW (ref 12.0–15.0)
Immature Granulocytes: 1 %
Lymphocytes Relative: 7 %
Lymphs Abs: 0.8 10*3/uL (ref 0.7–4.0)
MCH: 30.4 pg (ref 26.0–34.0)
MCHC: 33.8 g/dL (ref 30.0–36.0)
MCV: 90 fL (ref 80.0–100.0)
Monocytes Absolute: 0.8 10*3/uL (ref 0.1–1.0)
Monocytes Relative: 7 %
Neutro Abs: 9 10*3/uL — ABNORMAL HIGH (ref 1.7–7.7)
Neutrophils Relative %: 85 %
Platelets: 103 10*3/uL — ABNORMAL LOW (ref 150–400)
RBC: 3.39 MIL/uL — ABNORMAL LOW (ref 3.87–5.11)
RDW: 13.3 % (ref 11.5–15.5)
WBC: 10.6 10*3/uL — ABNORMAL HIGH (ref 4.0–10.5)
nRBC: 0 % (ref 0.0–0.2)

## 2021-10-12 LAB — CBC
HCT: 28.8 % — ABNORMAL LOW (ref 36.0–46.0)
Hemoglobin: 9.7 g/dL — ABNORMAL LOW (ref 12.0–15.0)
MCH: 30.3 pg (ref 26.0–34.0)
MCHC: 33.7 g/dL (ref 30.0–36.0)
MCV: 90 fL (ref 80.0–100.0)
Platelets: 103 10*3/uL — ABNORMAL LOW (ref 150–400)
RBC: 3.2 MIL/uL — ABNORMAL LOW (ref 3.87–5.11)
RDW: 13.2 % (ref 11.5–15.5)
WBC: 12.8 10*3/uL — ABNORMAL HIGH (ref 4.0–10.5)
nRBC: 0 % (ref 0.0–0.2)

## 2021-10-12 MED ORDER — WITCH HAZEL-GLYCERIN EX PADS: 1.0000 | MEDICATED_PAD | CUTANEOUS | Status: DC | PRN

## 2021-10-12 MED ORDER — COCONUT OIL OIL: 1.0000 | TOPICAL_OIL | Status: DC | PRN

## 2021-10-12 MED ORDER — SIMETHICONE 80 MG PO CHEW: 80.0000 mg | CHEWABLE_TABLET | ORAL | Status: DC | PRN

## 2021-10-12 MED ORDER — ONDANSETRON HCL 4 MG/2ML IJ SOLN: 4.0000 mg | INTRAMUSCULAR | Status: DC | PRN

## 2021-10-12 MED ORDER — ACETAMINOPHEN 325 MG PO TABS: 650.0000 mg | ORAL_TABLET | ORAL | Status: DC | PRN

## 2021-10-12 MED ORDER — ZOLPIDEM TARTRATE 5 MG PO TABS: 5.0000 mg | ORAL_TABLET | Freq: Every evening | ORAL | Status: DC | PRN

## 2021-10-12 MED ORDER — BENZOCAINE-MENTHOL 20-0.5 % EX AERO: 1.0000 | INHALATION_SPRAY | CUTANEOUS | Status: DC | PRN

## 2021-10-12 MED ORDER — IBUPROFEN 600 MG PO TABS
600.0000 mg | ORAL_TABLET | Freq: Four times a day (QID) | ORAL | Status: DC
  Administered 2021-10-12 (×4): 600 mg via ORAL
  Filled 2021-10-12 (×6): qty 1

## 2021-10-12 MED ORDER — PRENATAL MULTIVITAMIN CH
1.0000 | ORAL_TABLET | Freq: Every day | ORAL | Status: DC
  Administered 2021-10-12 – 2021-10-13 (×2): 1 via ORAL
  Filled 2021-10-12 (×2): qty 1

## 2021-10-12 MED ORDER — DIPHENHYDRAMINE HCL 25 MG PO CAPS: 25.0000 mg | ORAL_CAPSULE | Freq: Four times a day (QID) | ORAL | Status: DC | PRN

## 2021-10-12 MED ORDER — SENNOSIDES-DOCUSATE SODIUM 8.6-50 MG PO TABS
2.0000 | ORAL_TABLET | Freq: Every day | ORAL | Status: DC
  Administered 2021-10-13: 2 via ORAL
  Filled 2021-10-12: qty 2

## 2021-10-12 MED ORDER — TETANUS-DIPHTH-ACELL PERTUSSIS 5-2.5-18.5 LF-MCG/0.5 IM SUSY
0.5000 mL | PREFILLED_SYRINGE | Freq: Once | INTRAMUSCULAR | Status: AC
  Administered 2021-10-13: 0.5 mL via INTRAMUSCULAR
  Filled 2021-10-12: qty 0.5

## 2021-10-12 MED ORDER — INFLUENZA VAC SPLIT QUAD 0.5 ML IM SUSY: 0.5000 mL | PREFILLED_SYRINGE | INTRAMUSCULAR | Status: DC

## 2021-10-12 MED ORDER — DIBUCAINE (PERIANAL) 1 % EX OINT: 1.0000 | TOPICAL_OINTMENT | CUTANEOUS | Status: DC | PRN

## 2021-10-12 MED ORDER — ONDANSETRON HCL 4 MG PO TABS: 4.0000 mg | ORAL_TABLET | ORAL | Status: DC | PRN

## 2021-10-12 NOTE — Lactation Note (Signed)
This note was copied from a baby's chart. Lactation Consultation Note  Patient Name: Christie Fowler Today's Date: 10/12/2021 Reason for consult: L&D Initial assessment;Early term 37-38.6wks Age:23 hours, female infant. Infant initially was not latching , Birth Parent is experienced with breastfeeding  see maternal data below and Birth Parent  already knows how to hand express. Infant given 3 mls of EBM by spoon and became more interested in feeding. Infant licked and taste, would latch and suckle a few minutes then come off the breast , was on and off breast for 10 minutes. Birth Parent will continue to work towards latching infant at the breast, BF infant according to hunger cues, 8 to 12x/ 24 hours,STS. Birth Parent knows if infant doesn't latch to hand express and give infant back  her EBM.  Birth Parent knows to ask RN/LC for further latch assistance if needed. Birth Parent would benefit from being set up with DEBP, if infant doesn't latch at the breast.    Maternal Data Has patient been taught Hand Expression?: Yes How long did the patient breastfeed?: Per Birth Parent, 1st child was in NICU for 2 weeks, she BF her for 18 months, she is currently 82 months of age now.  Feeding Mother's Current Feeding Choice: Breast Milk  LATCH Score Latch: Repeated attempts needed to sustain latch, nipple held in mouth throughout feeding, stimulation needed to elicit sucking reflex.  Audible Swallowing: A few with stimulation  Type of Nipple: Everted at rest and after stimulation  Comfort (Breast/Nipple): Soft / non-tender  Hold (Positioning): Assistance needed to correctly position infant at breast and maintain latch.  LATCH Score: 7   Lactation Tools Discussed/Used    Interventions Interventions: Assisted with latch;Skin to skin;Hand express;Breast compression;Adjust position;Support pillows;Position options;Expressed milk;Education  Discharge    Consult Status Consult Status:  Follow-up from L&D    Danelle Earthly 10/12/2021, 1:43 AM

## 2021-10-12 NOTE — Lactation Note (Signed)
This note was copied from a baby's chart. Lactation Consultation Note  Patient Name: Christie Fowler OZHYQ'M Date: 10/12/2021 Reason for consult: Initial assessment;Early term 37-38.6wks;Infant < 6lbs;Breastfeeding assistance (as LC entered the room, baby latched on the Rt Br in the football with swallows. Baby released , nipple well rounded, and easily expressed 6 ml to spood feed back to baby.) Baby spoon fed 6 ml of EBM well and when off to sleep.  Age:72 hours Birth parent and support person - prefer to be referred to as mom and dad.  LC set up a DEBP , and mom pumped for 15 mins/ #24 F was a good fit and per mom comfortable. EBM 1-1 ml .  Maternal Data Has patient been taught Hand Expression?: Yes Does the patient have breastfeeding experience prior to this delivery?: Yes  Feeding Mother's Current Feeding Choice: Breast Milk  LATCH Score Latch:  (latched with depth)  Audible Swallowing:  (swallows noted)  Type of Nipple:  (nipple well rounded)  Comfort (Breast/Nipple):  (per mom comfortable)  Hold (Positioning):  (mom had latched the baby)      Lactation Tools Discussed/Used  DEBP - #24 F   Interventions Interventions: Skin to skin;Support pillows;Education;LC Services brochure  Discharge    Consult Status Consult Status: Follow-up Date: 10/12/21 Follow-up type: In-patient    Christie Fowler 10/12/2021, 9:09 AM

## 2021-10-12 NOTE — Lactation Note (Addendum)
This note was copied from a baby's chart. Lactation Consultation Note  Patient Name: Christie Fowler CNOBS'J Date: 10/12/2021 Reason for consult: Follow-up assessment;Mother's request;Early term 37-38.6wks;Breastfeeding assistance Age:23 hours  Birth parent recent feeding, see flowsheet.   Plan 1. To feed based on cues 8-12x 24hr period. Birth parent to offer breasts and look for signs of milk transfer.  2. Supplement with EBM first followed by [facility] with pace bottle feeding and slow flow nipple 10 ml or more 3 post pump after each feeding for 15 mins   All questions answered at the end of the visit.  Returned to adjust flange size for comfort.   Maternal Data Has patient been taught Hand Expression?: Yes  Feeding Mother's Current Feeding Choice: Breast Milk and Donor Milk Nipple Type: Extra Slow Flow  LATCH Score                    Lactation Tools Discussed/Used Tools: Pump;Flanges Flange Size: 24 Breast pump type: Double-Electric Breast Pump Pump Education: Setup, frequency, and cleaning;Milk Storage Reason for Pumping: increase stimulation Pumping frequency: post pump after each feeding for 15 mins  Interventions Interventions: Breast feeding basics reviewed;Skin to skin;Hand express;Expressed milk;DEBP;Education;Pace feeding;Infant Driven Feeding Algorithm education;LPT handout/interventions  Discharge Pump: DEBP;Personal WIC Program: Yes  Consult Status Consult Status: Follow-up Date: 10/13/21 Follow-up type: In-patient    Christie Fowler  Christie Fowler 10/12/2021, 1:04 PM

## 2021-10-12 NOTE — Discharge Summary (Addendum)
Postpartum Discharge Summary  Date of Service 10/13/21     Patient Name: Christie Fowler DOB: 02-10-2000 MRN: [id number]  Date of admission: 10/10/2021 Delivery date:10/12/2021  Delivering provider: Apolonio Schneiders  Date of discharge: 10/13/2021  Admitting diagnosis: Encounter for induction of labor [Z34.90] Intrauterine pregnancy: [redacted]w[redacted]d    Secondary diagnosis:  Principal Problem:   Encounter for induction of labor Active Problems:   Tylenol ingestion   Supervision of high risk pregnancy, antepartum   Hx of preeclampsia, prior pregnancy, currently pregnant   History of cesarean section   Th[place]ocytopenia affecting pregnancy ([place]   Anemia affecting pregnancy in third trimester   VBAC, delivered  Additional problems: nonoe    Discharge diagnosis: VBAC                                              Post partum procedures: Nexplanon insertion Augmentation: AROM, Pitocin, and IP Foley Complications: none  Hospital course: Induction of Labor With Vaginal Delivery   21y.o. yo G2P2002 at 382w2das admitted to the hospital 10/10/2021 for induction of labor.  Indication for induction:  TOLAC .  Patient had an uncomplicated labor course as follows: Membrane Rupture Time/Date: 4:45 PM ,10/11/2021   Delivery Method:VBAC, Spontaneous  Episiotomy: None  Lacerations:  Labial  Details of delivery can be found in separate delivery note.  Patient had a routine postpartum course. Patient is discharged home 10/13/21.  Newborn Data: Birth date:10/12/2021  Birth time:12:52 AM  Gender:Female  Living status:Living  Apgars:8 ,9  Weight:2699 g   Magnesium Sulfate received: No BMZ received: No Rhophylac:No MMR:N/A T-DaP:Given postpartum Flu: No Transfusion:No  Physical exam  Vitals:   10/12/21 1210 10/12/21 1521 10/12/21 2020 10/13/21 0615  BP: 117/75 109/60 112/75 96/62  Pulse: 68 71    Resp: 17 18 18 16   Temp: 98.1 F (36.7 C) 97.6 F (36.4 C) 97.7 F (36.5 C) 98 F (36.7 C)  TempSrc:  Oral Oral Oral Oral  SpO2: 98% 100% 100% 99%  Weight:      Height:       General: alert, cooperative, and no distress Lochia: appropriate Uterine Fundus: firm Incision: N/A DVT Evaluation: No evidence of DVT seen on physical exam. Labs: Lab Results  Component Value Date   WBC 12.8 (H) 10/12/2021   HGB 9.7 (L) 10/12/2021   HCT 28.8 (L) 10/12/2021   MCV 90.0 10/12/2021   PLT 103 (L) 10/12/2021      Latest Ref Rng & Units 09/18/2021    6:49 PM  CMP  Glucose 70 - 99 mg/dL 76   BUN 6 - 20 mg/dL <5   Creatinine 0.44 - 1.00 mg/dL 0.53   Sodium 135 - 145 mmol/L 134   Potassium 3.5 - 5.1 mmol/L 3.6   Chloride 98 - 111 mmol/L 104   CO2 22 - 32 mmol/L 22   Calcium 8.9 - 10.3 mg/dL 8.9   Total Protein 6.5 - 8.1 g/dL 6.5   Total Bilirubin 0.3 - 1.2 mg/dL 1.2   Alkaline Phos 38 - 126 U/L 127   AST 15 - 41 U/L 17   ALT 0 - 44 U/L 13    Edinburgh Score:    02/03/2020   12:32 AM  Edinburgh Postnatal Depression Scale Screening Tool  I have been able to laugh and see the funny side of things. 0  I have looked forward with enjoyment to things. 0  I have blamed myself unnecessarily when things went wrong. 0  I have been anxious or worried for no good reason. 0  I have felt scared or panicky for no good reason. 0  Things have been getting on top of me. 1  I have been so unhappy that I have had difficulty sleeping. 0  I have felt sad or miserable. 0  I have been so unhappy that I have been crying. 0  The thought of harming myself has occurred to me. 0  Edinburgh Postnatal Depression Scale Total 1     After visit meds:  Allergies as of 10/13/2021   No Known Allergies      Medication List     STOP taking these medications    prenatal multivitamin Tabs tablet       TAKE these medications    acetaminophen 325 MG tablet Commonly known as: Tylenol Take 2 tablets (650 mg total) by mouth every 4 (four) hours as needed (for pain scale < 4).   aspirin EC 81 MG tablet Take 1  tablet (81 mg total) by mouth daily. Swallow whole.   cyclobenzaprine 5 MG tablet Commonly known as: FLEXERIL Take 1 tablet (5 mg total) by mouth 3 (three) times daily as needed for muscle spasms.   ferrous sulfate 325 (65 FE) MG tablet Take 1 tablet (325 mg total) by mouth every other day.   ibuprofen 600 MG tablet Commonly known as: ADVIL Take 1 tablet (600 mg total) by mouth every 6 (six) hours.         Discharge home in stable condition Infant Feeding: Breast Infant Disposition:home with mother Discharge instruction: per After Visit Summary and Postpartum booklet. Activity: Advance as tolerated. Pelvic rest for 6 weeks.  Diet: routine diet Future Appointments: Future Appointments  Date Time Provider [place]  11/30/2021 10:55 AM Gavin Pound, CNM [place] None   Follow up Visit:  Message sent 10/12/21  Please schedule this patient for a In person postpartum visit in 6 weeks with the following provider: Any provider. Additional Postpartum F/U: None   High risk pregnancy complicated by:  poor fetal growth  Delivery mode:  VBAC, Spontaneous  Anticipated Birth Control:  Nexplanon   10/13/2021 Salvadore Oxford, MD  Attestation of Supervision of Student:  I confirm that I have verified the information documented in the  resident  student's note and that I have also personally reperformed the history, physical exam and all medical decision making activities.  I have verified that all services and findings are accurately documented in this student's note; and I agree with management and plan as outlined in the documentation. I have also made any necessary editorial changes. -nexplanon placed prior to discharge   [place], [place] [facility], [place] [facility] 10/13/2021 2:52 PM

## 2021-10-12 NOTE — Progress Notes (Signed)
CSW received consult for hx of Depression and SI.  CSW met with MOB to offer support and complete assessment, MOB was accompanied by her mother in law. CSW introduced self and asked to speak with MOB privately, MOB's mother in law left the room. CSW explained reason for consult. MOB was welcoming, open, pleasant, and remained engaged during assessment. CSW and MOB discussed MOB's mental health history. MOB denied any history of depression and reported that the diagnosis was inaccurate. MOB denied any depressive symptoms. MOB denied any mental health history. CSW inquired about any history of postpartum depression, MOB endorsed a history of postpartum depression that lasted for one month from delivery. MOB attributed her postpartum depression to multiple stressors going on at that time. MOB shared that she had COVID after delivery and she was unable to see infant for two weeks. MOB shared about how it was a hurtful experience. MOB described her postpartum depression as being sad and tearful. MOB reported that she did not take any medication to treat PPD and shared that her symptoms gradually went away on their own. CSW inquired about MOB's participation in therapy. MOB shared that she went to therapy to deal with childhood trauma and adjusting to motherhood. MOB shared that her grandmother raised her and passed 20 days before she gave birth to her oldest daughter. MOB reported that therapy was helpful and she doesn't feel that she needs therapy anymore. MOB acknowledged that she missed the last two scheduled therapy sessions. CSW asked if MOB needed therapy resources, MOB declined. CSW inquired about how MOB was feeling emotionally since giving birth, MOB reported that she was feeling extremely happy and shared that this has been a wonderful experience. MOB presented calm and did not demonstrate any acute mental health signs/symptoms. CSW assessed for safety, MOB denied SI, HI, and domestic violence. CSW inquired  about MOB's support system, MOB reported that her husband and husband's family are supports.   CSW provided education regarding the baby blues period vs. perinatal mood disorders, discussed treatment and gave resources for mental health follow up if concerns arise.  CSW recommends self-evaluation during the postpartum time period using the New Mom Checklist from Postpartum Progress and encouraged MOB to contact a medical professional if symptoms are noted at any time.    CSW provided review of Sudden Infant Death Syndrome (SIDS) precautions. MOB verbalized understanding and reported having all items needed to care for infant including car seat and bedside basinet.   CSW identifies no further need for intervention and no barriers to discharge at this time.  Halvor Behrend, LCSW Clinical Social Worker [facility] Cell#: [phone]   

## 2021-10-12 NOTE — Anesthesia Postprocedure Evaluation (Signed)
Anesthesia Post Note  Patient: Christie Fowler  Procedure(s) Performed: AN AD HOC LABOR EPIDURAL     Anesthesia Post Evaluation No notable events documented.  Last Vitals:  Vitals:   10/12/21 0308 10/12/21 0610  BP: 123/74 (!) 101/46  Pulse: 67 67  Resp: 16 17  Temp: 36.7 C 37 C  SpO2: 98% 98%    Last Pain:  Vitals:   10/12/21 0610  TempSrc: Oral  PainSc: 0-No pain   Pain Goal: Patients Stated Pain Goal: 0 (10/12/21 0231)                 Sonda Primes

## 2021-10-13 ENCOUNTER — Encounter (HOSPITAL_COMMUNITY): Payer: Self-pay | Admitting: Obstetrics and Gynecology

## 2021-10-13 DIAGNOSIS — Z30017 Encounter for initial prescription of implantable subdermal contraceptive: Secondary | ICD-10-CM | POA: Diagnosis not present

## 2021-10-13 HISTORY — PX: NEXPLANON TRAY: NUR84248

## 2021-10-13 MED ORDER — ETONOGESTREL 68 MG ~~LOC~~ IMPL
68.0000 mg | DRUG_IMPLANT | Freq: Once | SUBCUTANEOUS | Status: AC
  Administered 2021-10-13: 68 mg via SUBCUTANEOUS
  Filled 2021-10-13: qty 1

## 2021-10-13 MED ORDER — ACETAMINOPHEN 325 MG PO TABS: 650.0000 mg | ORAL_TABLET | ORAL | Status: DC | PRN

## 2021-10-13 MED ORDER — IBUPROFEN 600 MG PO TABS: 600.0000 mg | ORAL_TABLET | Freq: Four times a day (QID) | ORAL | 0 refills | Status: DC

## 2021-10-13 MED ORDER — LIDOCAINE HCL 1 % IJ SOLN
0.0000 mL | Freq: Once | INTRAMUSCULAR | Status: AC | PRN
  Administered 2021-10-13: 3 mL via INTRADERMAL
  Filled 2021-10-13: qty 20

## 2021-10-13 NOTE — Procedures (Addendum)
Post-Placental Nexplanon Insertion Procedure Note  Patient was identified. Informed consent was signed, signed copy in chart. A time-out was performed.    The insertion site was identified 8-10 cm (3-4 inches) from the medial epicondyle of the humerus and 3-5 cm (1.25-2 inches) posterior to (below) the sulcus (groove) between the biceps and triceps muscles of the patient's left arm and marked. The site was prepped and draped in the usual sterile fashion. Pt was prepped with alcohol swab and then injected with 3 cc of 1% lidocaine. The site was prepped with betadine. Nexplanon removed form packaging,  Device confirmed in needle, then inserted full length of needle and withdrawn per handbook instructions. Provider and patient verified presence of the implant in the woman's arm by palpation. Pt insertion site was covered with steristrips/adhesive bandage and pressure bandage. There was minimal blood loss. Patient tolerated procedure well.  Patient was given post procedure instructions and Nexplanon user card with expiration date. Condoms were recommended for STI prevention. Patient was asked to keep the pressure dressing on for 24 hours to minimize bruising and keep the adhesive bandage on for 3-5 days. The patient verbalized understanding of the plan of care and agrees.   Lot # [id number] Expiration Date 2025-04   Attestation of Supervision of Student:  I confirm that I have verified the information documented in the  resident  student's note and that I have also personally reperformed the history, physical exam and all medical decision making activities.  I have verified that all services and findings are accurately documented in this student's note; and I agree with management and plan as outlined in the documentation. I have also made any necessary editorial changes.    Marylene Land, CNM Center for [employer], [facility] 10/13/2021 2:16 PM

## 2021-10-13 NOTE — Progress Notes (Addendum)
POSTPARTUM PROGRESS NOTE  Subjective: Christie Fowler is a 21 y.o. [id number] s/p VD at [redacted]w[redacted]d.  She reports she is doing well. No acute events overnight. She denies any problems with ambulating, voiding or po intake. Denies nausea or vomiting. She has passed flatus. She has not had bowel movement. Pain is well controlled.  Lochia is Minimal.  Denies headache, scotomata, and RUQ pain, dizziness, dyspnea, and chest palpitation.  Objective: BP 96/62 (BP Location: Right Arm)   Pulse 71   Temp 98 F (36.7 C) (Oral)   Resp 16   Ht 5\' 3"  (1.6 m)   Wt 54.4 kg   LMP 01/24/2021 (Exact Date)   SpO2 99%   Breastfeeding Unknown   BMI 21.26 kg/m   Physical Exam:  General: alert, cooperative and no distress Chest: CTAB, no respiratory distress Abdomen: soft, non-tender  Uterine Fundus: firm, appropriately tender Extremities: No calf swelling, tenderness, or edema  Recent Labs    10/12/21 0157 10/12/21 0455  HGB 10.3* 9.7*  HCT 30.5* 28.8*    Assessment/Plan: Christie Fowler is a 21 y.o. 30 s/p VD at [redacted]w[redacted]d for IUGR.   LOS: 3 days   PPD#1: Doing well, pain well-controlled.  -- Routine postpartum care, lactation support -- Encouraged up OOB -- Contraception:  Nexplanon -- Feeding: breast feeding -- Circumcision: N/A  Thrombocytopenia: No active bleeding -- platelets 103 on 10/12/21 -- no signs of bleeding, continue to monitor  Hx of Depression: -- social work signed off -- no concerning mood symptoms at this time  Dispo: Plan for discharge likely tomorrow.  12/12/21, Medical Student  10/13/2021, 10:29 AM   Attestation of Supervision of Student:  I confirm that I have verified the information documented in the medical student's note and that I have also personally reperformed the history, physical exam and all medical decision making activities.  I have verified that all services and findings are accurately documented in this student's note; and I agree with management and  plan as outlined in the documentation. I have also made any necessary editorial changes.    12/13/2021, CNM Center for Marylene Land, [facility] 10/13/2021 1:12 PM

## 2021-10-13 NOTE — Lactation Note (Signed)
This note was copied from a baby's chart. Lactation Consultation Note  Patient Name: Christie Fowler JSEGB'T Date: 10/13/2021 Reason for consult: Follow-up assessment;Mother's request;Difficult latch;Early term 37-38.6wks;Breastfeeding assistance Age:23 hours  Infant latching easier according to birth parent getting 20 mins feedings.   Plan 1. To feed based on cues 8-12x 24hr period. Birth parent to offer breasts and look for signs of milk transfer.  2. Birth parent to supplement with EBM first followed by formula. Birth parent working and offering more volume, with pace bottle feeding and extra slow flow nipple.  3 Post pump after each feeding for 15 mins   All questions answered at the end of the visit.   Maternal Data Has patient been taught Hand Expression?: Yes  Feeding Mother's Current Feeding Choice: Breast Milk and Formula  LATCH Score                    Lactation Tools Discussed/Used Tools: Pump;Flanges Flange Size: 21;24 Breast pump type: Double-Electric Breast Pump Pump Education: Setup, frequency, and cleaning;Milk Storage Reason for Pumping: increase stimulation Pumping frequency: post pump after each feeding for 15 mins  Interventions Interventions: Breast feeding basics reviewed;Skin to skin;Breast compression;Position options;Expressed milk;DEBP;Education;Pace feeding;LC Psychologist, educational;Infant Driven Feeding Algorithm education;LPT handout/interventions  Discharge Discharge Education: Engorgement and breast care;Warning signs for feeding baby;Outpatient recommendation;Outpatient Epic message sent Pump: DEBP;Personal WIC Program: Yes  Consult Status Consult Status: Complete Date: 10/13/21    Vishwa Dais  Nicholson-Springer 10/13/2021, 11:50 AM

## 2021-10-20 ENCOUNTER — Telehealth (HOSPITAL_COMMUNITY): Payer: Self-pay | Admitting: *Deleted

## 2021-10-20 NOTE — Telephone Encounter (Signed)
Left phone voicemail message.  Duffy Rhody, RN 10-20-2021 at 2:17pm

## 2021-11-05 DIAGNOSIS — Z419 Encounter for procedure for purposes other than remedying health state, unspecified: Secondary | ICD-10-CM | POA: Diagnosis not present

## 2021-11-30 ENCOUNTER — Ambulatory Visit: Payer: Medicaid Other

## 2021-12-06 DIAGNOSIS — Z419 Encounter for procedure for purposes other than remedying health state, unspecified: Secondary | ICD-10-CM | POA: Diagnosis not present

## 2021-12-19 ENCOUNTER — Ambulatory Visit (INDEPENDENT_AMBULATORY_CARE_PROVIDER_SITE_OTHER): Payer: Medicaid Other | Admitting: Obstetrics

## 2021-12-19 ENCOUNTER — Encounter: Payer: Self-pay | Admitting: Obstetrics

## 2021-12-19 DIAGNOSIS — Z3042 Encounter for surveillance of injectable contraceptive: Secondary | ICD-10-CM

## 2021-12-19 DIAGNOSIS — Z3046 Encounter for surveillance of implantable subdermal contraceptive: Secondary | ICD-10-CM | POA: Diagnosis not present

## 2021-12-19 NOTE — Progress Notes (Signed)
Post Partum Visit Note  Christie Fowler is a 21 y.o. G30P2002 female who presents for a postpartum visit. She is 8 weeks postpartum following a normal spontaneous vaginal delivery.  I have fully reviewed the prenatal and intrapartum course. The delivery was at 37.2 gestational weeks.  Anesthesia: epidural. Postpartum course has been unremarkable. Baby is doing well. Baby is feeding by breast. Bleeding no bleeding. Bowel function is normal. Bladder function is normal. Patient is not sexually active. Contraception method is Nexplanon. Postpartum depression screening: negative.   Upstream - 12/19/21 1053       Pregnancy Intention Screening   Does the patient want to become pregnant in the next year? No    Does the patient's partner want to become pregnant in the next year? No    Would the patient like to discuss contraceptive options today? No      Contraception Wrap Up   Current Method Hormonal Implant    End Method Hormonal Implant            The pregnancy intention screening data noted above was reviewed. Potential methods of contraception were discussed. The patient elected to proceed with Hormonal Implant.   Edinburgh Postnatal Depression Scale - 12/19/21 1050       Edinburgh Postnatal Depression Scale:  In the Past 7 Days   I have been able to laugh and see the funny side of things. 0    I have looked forward with enjoyment to things. 0    I have blamed myself unnecessarily when things went wrong. 0    I have been anxious or worried for no good reason. 0    I have felt scared or panicky for no good reason. 0    Things have been getting on top of me. 0    I have been so unhappy that I have had difficulty sleeping. 0    I have felt sad or miserable. 0    I have been so unhappy that I have been crying. 0    The thought of harming myself has occurred to me. 0    Edinburgh Postnatal Depression Scale Total 0             Health Maintenance Due  Topic Date Due   INFLUENZA  VACCINE  09/05/2021    The following portions of the patient's history were reviewed and updated as appropriate: allergies, current medications, past family history, past medical history, past social history, past surgical history, and problem list.  Review of Systems A comprehensive review of systems was negative.  Objective:  BP 132/78   Pulse 65   Ht 5' 3.5" (1.613 m)   Wt 121 lb (54.9 kg)   LMP 10/15/2021 (Exact Date)   Breastfeeding Yes   BMI 21.10 kg/m    General:  alert and no distress   Breasts:  normal  Lungs: clear to auscultation bilaterally  Heart:  regular rate and rhythm, S1, S2 normal, no murmur, click, rub or gallop  Abdomen: soft, non-tender; bowel sounds normal; no masses,  no organomegaly   Wound none  GU exam:  not indicated       Assessment:    1. Postpartum care following vaginal delivery - doing well  2. Encounter for surveillance of injectable contraceptive - having irregular spotting - discussed that this nis normal at 1this point with Nexplanon    Plan:   Essential components of care per ACOG recommendations:  1.  Mood and well being: Patient  with negative depression screening today. Reviewed local resources for support.  - Patient tobacco use? No.   - hx of drug use? No.    2. Infant care and feeding:  -Patient currently breastmilk feeding? Yes. Discussed returning to work and pumping.  -Social determinants of health (SDOH) reviewed in EPIC. No concerns  3. Sexuality, contraception and birth spacing - Patient does not want a pregnancy in the next year.  Desired family size is 2 children.  - Reviewed reproductive life planning. Reviewed contraceptive methods based on pt preferences and effectiveness.  Patient desired Hormonal Implant today.   - Discussed birth spacing of 18 months  4. Sleep and fatigue -Encouraged family/partner/community support of 4 hrs of uninterrupted sleep to help with mood and fatigue  5. Physical Recovery  -  Discussed patients delivery and complications. She describes her labor as good. - Patient had a Vaginal, no problems at delivery. Patient had a 1st degree laceration. Perineal healing reviewed. Patient expressed understanding - Patient has urinary incontinence? No. - Patient is safe to resume physical and sexual activity  6.  Health Maintenance - HM due items addressed Yes - Last pap smear  Diagnosis  Date Value Ref Range Status  03/23/2021   Final   - Negative for intraepithelial lesion or malignancy (NILM)   Pap smear not done at today's visit.  -Breast Cancer screening indicated? No.   7. Chronic Disease/Pregnancy Condition follow up: None    Perseus Westall A.Clearance Coots MD [facility], [facility], [facility] 12/19/21

## 2022-01-05 DIAGNOSIS — Z419 Encounter for procedure for purposes other than remedying health state, unspecified: Secondary | ICD-10-CM | POA: Diagnosis not present

## 2022-02-05 DIAGNOSIS — Z419 Encounter for procedure for purposes other than remedying health state, unspecified: Secondary | ICD-10-CM | POA: Diagnosis not present

## 2022-03-08 DIAGNOSIS — Z419 Encounter for procedure for purposes other than remedying health state, unspecified: Secondary | ICD-10-CM | POA: Diagnosis not present

## 2022-04-06 DIAGNOSIS — Z419 Encounter for procedure for purposes other than remedying health state, unspecified: Secondary | ICD-10-CM | POA: Diagnosis not present

## 2022-05-07 DIAGNOSIS — Z419 Encounter for procedure for purposes other than remedying health state, unspecified: Secondary | ICD-10-CM | POA: Diagnosis not present

## 2022-08-10 ENCOUNTER — Other Ambulatory Visit: Payer: Self-pay

## 2022-08-10 ENCOUNTER — Emergency Department (HOSPITAL_COMMUNITY)
Admission: EM | Admit: 2022-08-10 | Discharge: 2022-08-10 | Disposition: A | Discharge status: Home / Self Care | Payer: Self-pay | Attending: Emergency Medicine | Admitting: Emergency Medicine

## 2022-08-10 ENCOUNTER — Emergency Department (HOSPITAL_COMMUNITY): Payer: Self-pay

## 2022-08-10 DIAGNOSIS — J069 Acute upper respiratory infection, unspecified: Secondary | ICD-10-CM | POA: Insufficient documentation

## 2022-08-10 LAB — CBC WITH DIFFERENTIAL/PLATELET
Abs Immature Granulocytes: 0.02 10*3/uL (ref 0.00–0.07)
Basophils Absolute: 0 10*3/uL (ref 0.0–0.1)
Basophils Relative: 0 %
Eosinophils Absolute: 0.2 10*3/uL (ref 0.0–0.5)
Eosinophils Relative: 3 %
HCT: 36.5 % (ref 36.0–46.0)
Hemoglobin: 12.2 g/dL (ref 12.0–15.0)
Immature Granulocytes: 0 %
Lymphocytes Relative: 18 %
Lymphs Abs: 1.3 10*3/uL (ref 0.7–4.0)
MCH: 29.5 pg (ref 26.0–34.0)
MCHC: 33.4 g/dL (ref 30.0–36.0)
MCV: 88.2 fL (ref 80.0–100.0)
Monocytes Absolute: 0.5 10*3/uL (ref 0.1–1.0)
Monocytes Relative: 7 %
Neutro Abs: 5.1 10*3/uL (ref 1.7–7.7)
Neutrophils Relative %: 72 %
Platelets: 123 10*3/uL — ABNORMAL LOW (ref 150–400)
RBC: 4.14 MIL/uL (ref 3.87–5.11)
RDW: 12.9 % (ref 11.5–15.5)
WBC: 7.2 10*3/uL (ref 4.0–10.5)
nRBC: 0 % (ref 0.0–0.2)

## 2022-08-10 LAB — BASIC METABOLIC PANEL
Anion gap: 12 (ref 5–15)
BUN: 7 mg/dL (ref 6–20)
CO2: 21 mmol/L — ABNORMAL LOW (ref 22–32)
Calcium: 9.3 mg/dL (ref 8.9–10.3)
Chloride: 105 mmol/L (ref 98–111)
Creatinine, Ser: 0.7 mg/dL (ref 0.44–1.00)
GFR, Estimated: >60 mL/min (ref >60)
Glucose, Bld: 89 mg/dL (ref 70–99)
Potassium: 3.4 mmol/L — ABNORMAL LOW (ref 3.5–5.1)
Sodium: 138 mmol/L (ref 135–145)

## 2022-08-10 LAB — HCG, QUANTITATIVE, PREGNANCY: hCG, Beta Chain, Quant, S: <1 m[IU]/mL (ref <5)

## 2022-08-10 NOTE — ED Triage Notes (Signed)
Patient reports chest congestion with productive cough and mild SOB onset yesterday .

## 2022-08-10 NOTE — ED Provider Notes (Signed)
[place] EMERGENCY DEPARTMENT AT [facility] Provider Note   CSN: [id number] Arrival date & time: 08/10/22  2033     History Chief Complaint  Patient presents with   Cough    HPI Christie Fowler is a 22 y.o. female presenting for URI symptoms that started about a three days ago. Her throat hurts, has myalgias, arthralgias, fever and chills. Went to [facility] and was very cold, did not have appropriate clothing. She is worried about a pneumonia as she had chest tightness and wheezing.   Patient's recorded medical, surgical, social, medication list and allergies were reviewed in the Snapshot window as part of the initial history.   Review of Systems   Review of Systems  Constitutional:  Positive for chills and fever.  Respiratory:  Positive for cough, shortness of breath and wheezing.   Cardiovascular:  Negative for palpitations.  Gastrointestinal:  Positive for nausea. Negative for abdominal pain, diarrhea and vomiting.  Musculoskeletal:  Positive for arthralgias and myalgias.    Physical Exam Updated Vital Signs BP (!) 142/75   Pulse 97   Temp 98.9 F (37.2 C) (Oral)   Resp 16   Ht 5\' 4"  (1.626 m)   Wt 58.1 kg   SpO2 99%   BMI 21.97 kg/m  Physical Exam Constitutional:      General: She is not in acute distress.    Appearance: She is not ill-appearing.  HENT:     Nose: Congestion present.  Eyes:     Conjunctiva/sclera: Conjunctivae normal.  Cardiovascular:     Rate and Rhythm: Normal rate.     Heart sounds: S1 normal and S2 normal. No murmur heard.    No friction rub. No gallop.  Pulmonary:     Effort: No accessory muscle usage, respiratory distress or retractions.     Breath sounds: Wheezing and rales present.  Abdominal:     General: Bowel sounds are normal.     Tenderness: There is no abdominal tenderness.  Skin:    Comments: No new rashes  Neurological:     Mental Status: She is alert.   ED Course/ Medical Decision Making/ A&P    Medications  Ordered in ED Medications - No data to display  Medical Decision Making:    Christie Fowler is a 22 y.o. female who presented to the ED today with cough, SOB and fevers for the last three days. Daughter has similar symptoms.   Reviewed and confirmed nursing documentation for past medical history, family history, social history.    Initial Assessment:   With the patient's presentation the most likely diagnosis is a viral infection. Other diagnoses were considered including (but not limited to) pneumonia.   Initial Plan:  Screening labs including CBC and Metabolic panel to evaluate for infectious or metabolic etiology of disease.  CXR to evaluate for structural/infectious intrathoracic pathology.  Objective evaluation as below reviewed with plan for close reassessment  Initial Study Results:   Laboratory  All laboratory results reviewed without evidence of clinically relevant pathology.    Results for orders placed or performed during the hospital encounter of 08/10/22  CBC with Differential  Result Value Ref Range   WBC 7.2 4.0 - 10.5 K/uL   RBC 4.14 3.87 - 5.11 MIL/uL   Hemoglobin 12.2 12.0 - 15.0 g/dL   HCT 40.9 81.1 - 91.4 %   MCV 88.2 80.0 - 100.0 fL   MCH 29.5 26.0 - 34.0 pg   MCHC 33.4 30.0 - 36.0 g/dL  RDW 12.9 11.5 - 15.5 %   Platelets 123 (L) 150 - 400 K/uL   nRBC 0.0 0.0 - 0.2 %   Neutrophils Relative % 72 %   Neutro Abs 5.1 1.7 - 7.7 K/uL   Lymphocytes Relative 18 %   Lymphs Abs 1.3 0.7 - 4.0 K/uL   Monocytes Relative 7 %   Monocytes Absolute 0.5 0.1 - 1.0 K/uL   Eosinophils Relative 3 %   Eosinophils Absolute 0.2 0.0 - 0.5 K/uL   Basophils Relative 0 %   Basophils Absolute 0.0 0.0 - 0.1 K/uL   Immature Granulocytes 0 %   Abs Immature Granulocytes 0.02 0.00 - 0.07 K/uL  Basic metabolic panel  Result Value Ref Range   Sodium 138 135 - 145 mmol/L   Potassium 3.4 (L) 3.5 - 5.1 mmol/L   Chloride 105 98 - 111 mmol/L   CO2 21 (L) 22 - 32 mmol/L   Glucose, Bld 89  70 - 99 mg/dL   BUN 7 6 - 20 mg/dL   Creatinine, Ser 1.61 0.44 - 1.00 mg/dL   Calcium 9.3 8.9 - 09.6 mg/dL   GFR, Estimated >04 >54 mL/min   Anion gap 12 5 - 15  hCG, quantitative, pregnancy  Result Value Ref Range   hCG, Beta Chain, Quant, S <1 <5 mIU/mL   Radiology  All images reviewed independently. Agree with radiology report at this time.    DG Chest 2 View  Result Date: 08/10/2022 CLINICAL DATA:  Shortness of breath EXAM: CHEST - 2 VIEW COMPARISON:  None Available. FINDINGS: The heart size and mediastinal contours are within normal limits. Both lungs are clear. The visualized skeletal structures are unremarkable. IMPRESSION: No active cardiopulmonary disease. Electronically Signed   By: Allegra Lai M.D.   On: 08/10/2022 21:57      Reassessment and Plan:    Clinical Impression:  1. Viral upper respiratory infection     The patient was reassured and education of viral URIs provided.   Discharge   Final Clinical Impression(s) / ED Diagnoses Final diagnoses:  Viral upper respiratory infection    Rx / DC Orders ED Discharge Orders     None         Hassan Rowan, [place], MD 08/10/22 2310    Gwyneth Sprout, MD 08/10/22 2310

## 2022-09-20 IMAGING — US US RENAL
1 series · 15 of 25 positions shown · non-contrast
Comparison: None.

CLINICAL DATA: Hematuria 28 weeks pregnant

EXAM:
RENAL / URINARY TRACT ULTRASOUND COMPLETE

[Series 1: us renal · 15 of 34 slices shown]
[im 1/34]
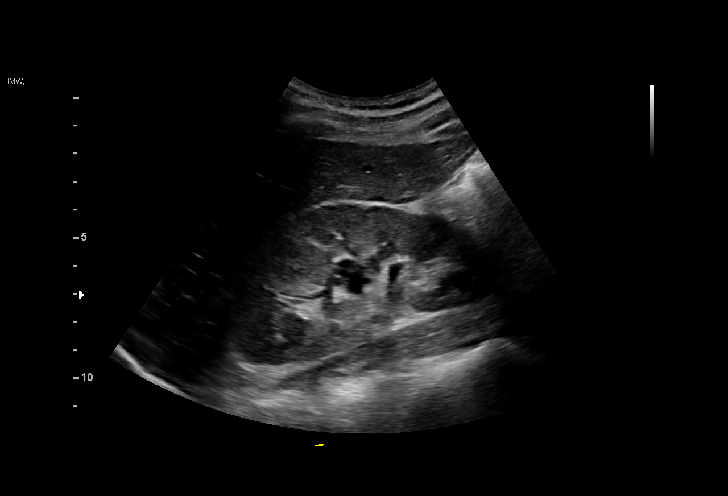
[im 3/34]
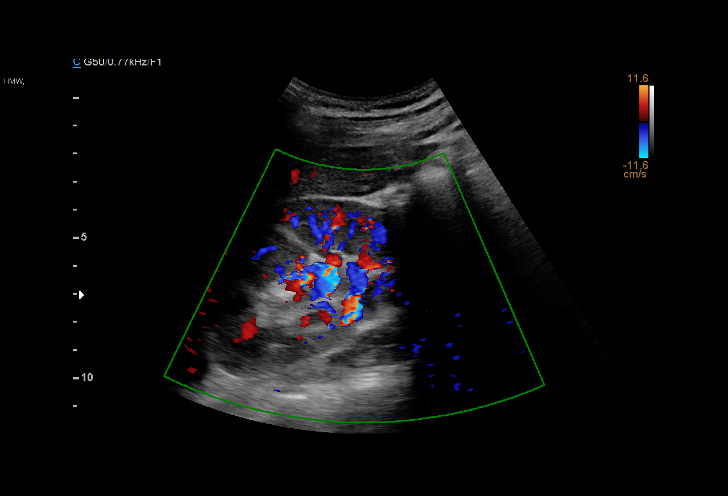
[im 6/34]
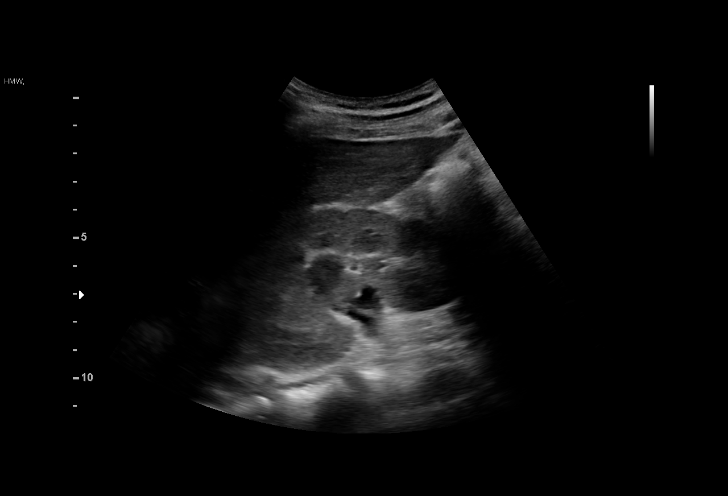
[im 7/34]
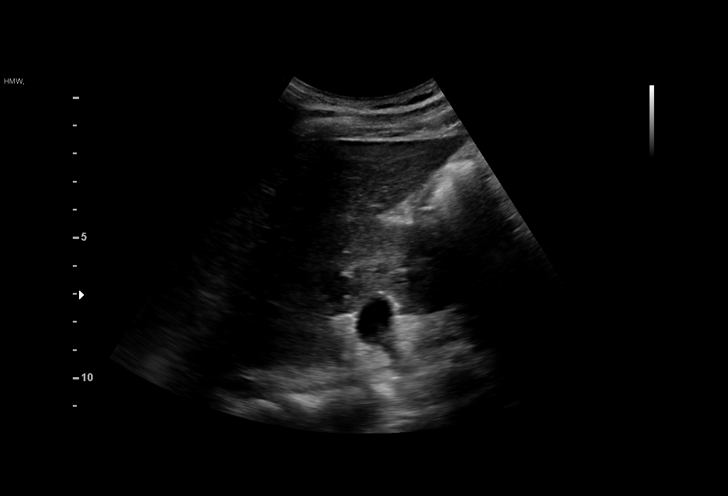
[im 10/34]
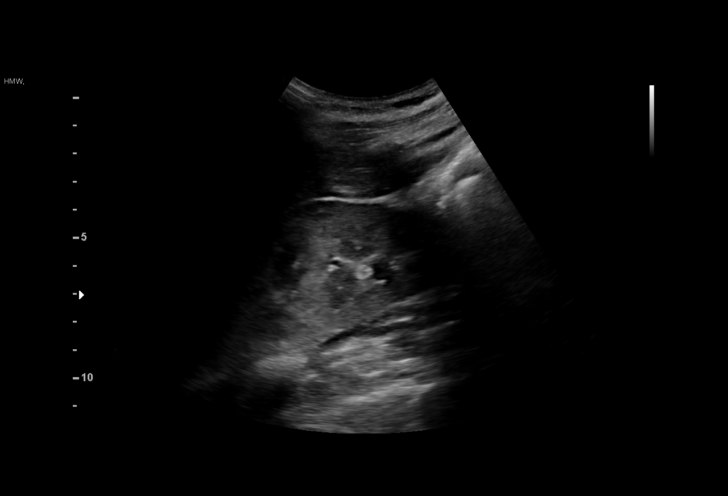
[im 13/34]
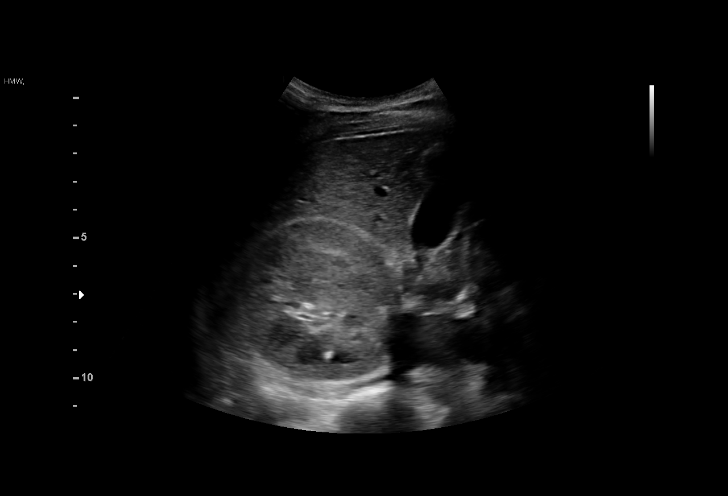
[im 14/34]
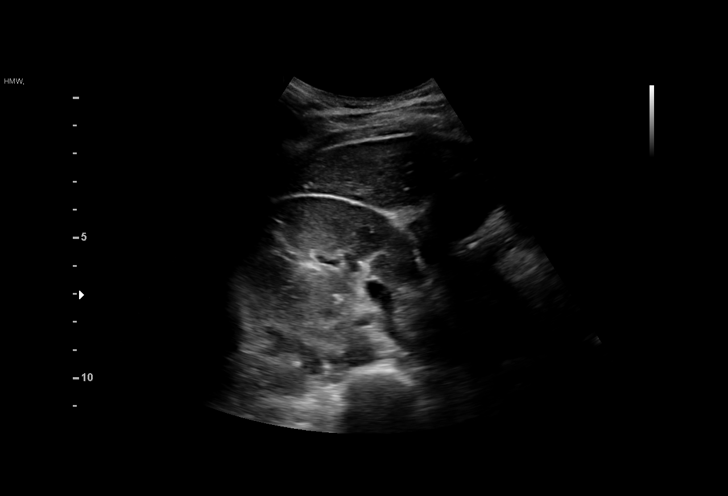
[im 17/34]
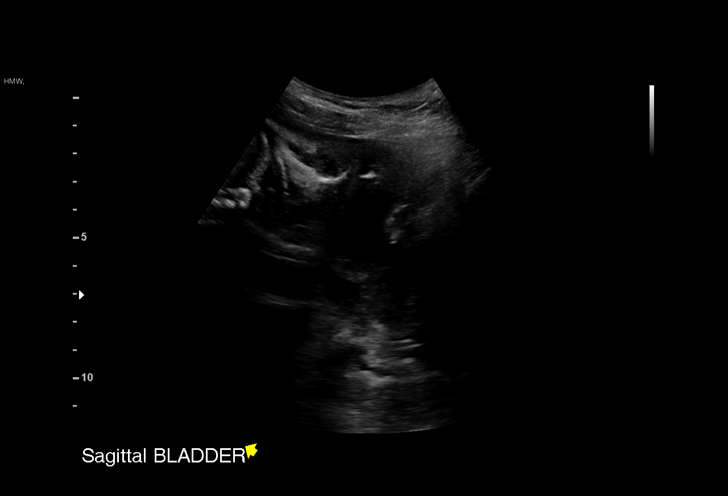
[im 20/34]
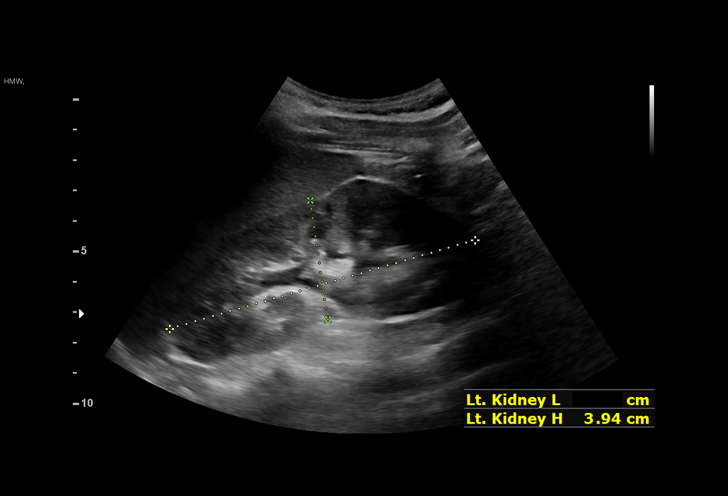
[im 21/34]
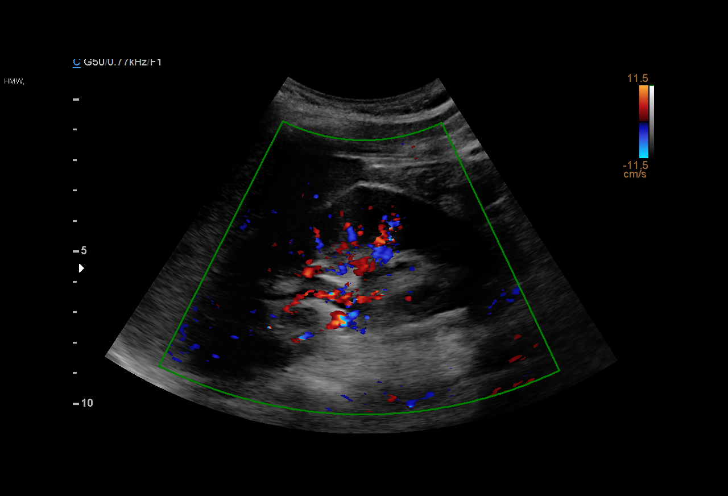
[im 24/34]
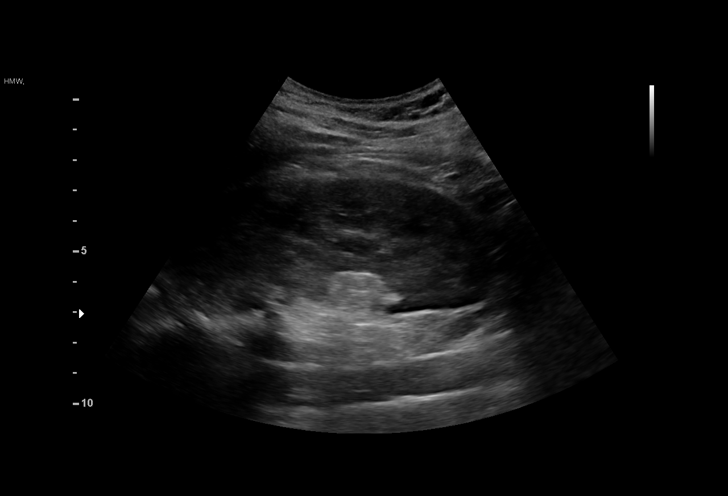
[im 27/34]
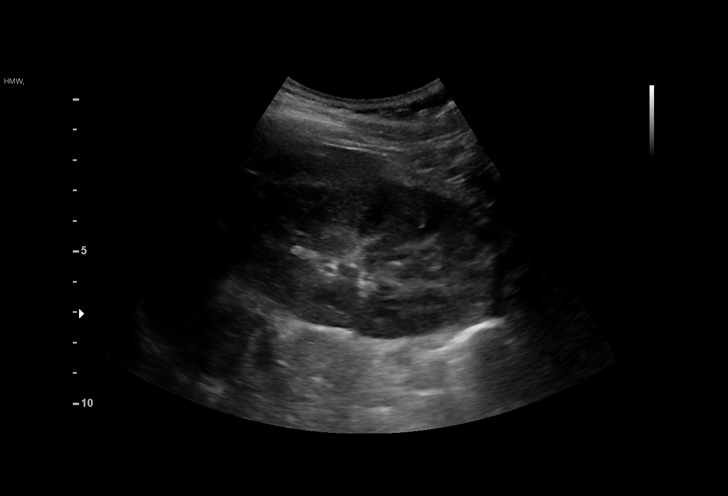
[im 28/34]
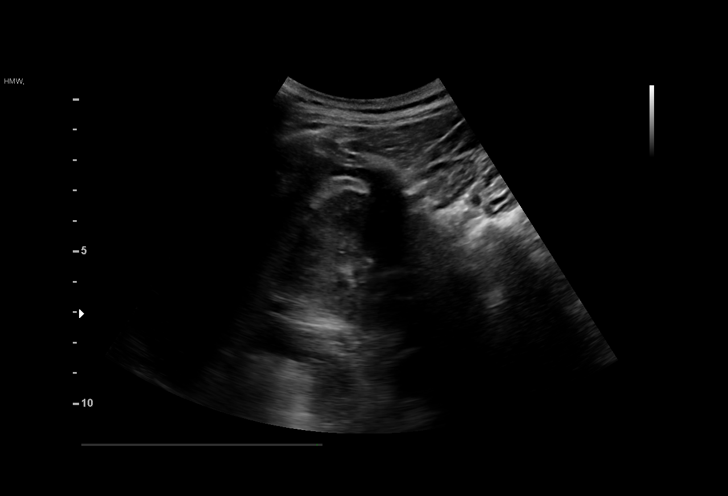
[im 31/34]
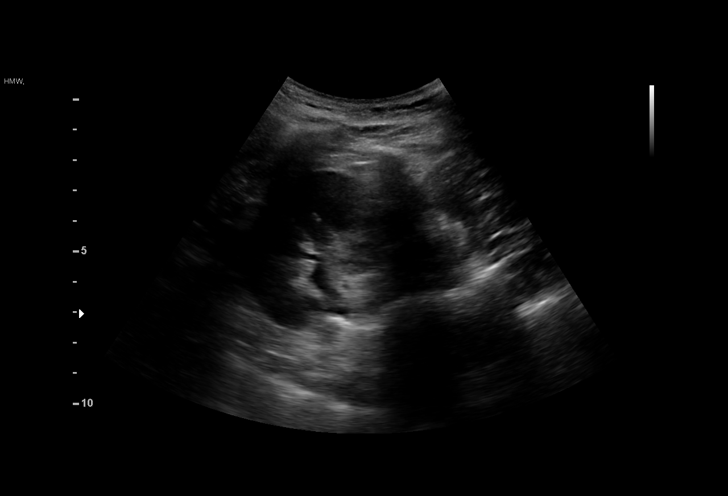
[im 34/34]
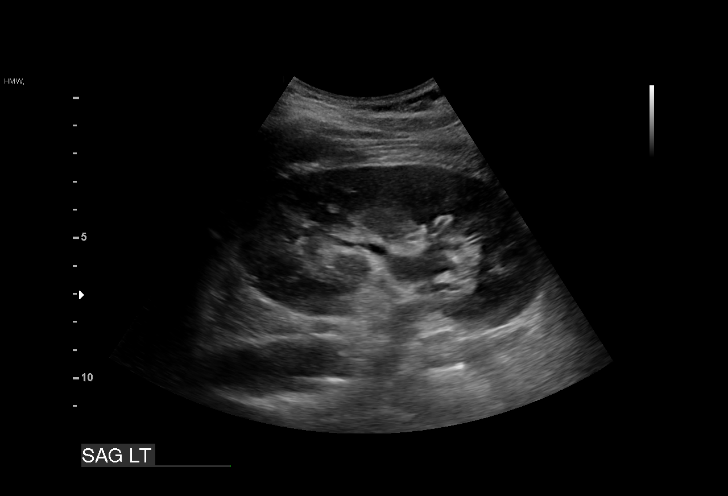

[15 of 25 positions shown; findings below may reference images not displayed]

FINDINGS: Right Kidney:

Renal measurements: 9.6 x 5.2 x 5.2 cm = volume: 135.8 mL.
Echogenicity within normal limits. No mass or hydronephrosis
visualized.

Left Kidney:

Renal measurements: 10.4 x 3.9 x 4.6 cm = volume: 98.7 mL.
Echogenicity within normal limits. No mass or hydronephrosis
visualized.

Bladder:

Appears normal for degree of bladder distention.

Other:

None.
IMPRESSION: Negative examination

## 2023-01-14 ENCOUNTER — Telehealth: Payer: Self-pay | Admitting: Family Medicine

## 2023-01-14 ENCOUNTER — Other Ambulatory Visit (HOSPITAL_COMMUNITY)
Admission: RE | Admit: 2023-01-14 | Discharge: 2023-01-14 | Disposition: A | Discharge status: Home / Self Care | Payer: Self-pay | Source: Ambulatory Visit | Attending: Family Medicine | Admitting: Family Medicine

## 2023-01-14 ENCOUNTER — Ambulatory Visit (INDEPENDENT_AMBULATORY_CARE_PROVIDER_SITE_OTHER): Payer: Self-pay | Admitting: Family Medicine

## 2023-01-14 VITALS — BP 116/73 | HR 78 | Ht 63.0 in | Wt 113.0 lb

## 2023-01-14 DIAGNOSIS — N898 Other specified noninflammatory disorders of vagina: Secondary | ICD-10-CM

## 2023-01-14 LAB — POCT WET PREP (WET MOUNT)
Clue Cells Wet Prep Whiff POC: POSITIVE
Trichomonas Wet Prep HPF POC: ABSENT

## 2023-01-14 MED ORDER — METRONIDAZOLE 500 MG PO TABS: 500.0000 mg | ORAL_TABLET | Freq: Two times a day (BID) | ORAL | 0 refills | Status: DC

## 2023-01-14 NOTE — Patient Instructions (Signed)
Good to see you today - Thank you for coming in  Things we discussed today:  1) Your symptoms sound most likely due to vacterial vaginosis or yeast infection. These can be caused by dysregulation of your vaginal pH such as new soaps, lubes, or lotions in the vagina. - We will check your tests for these infections today and let you know the results via MyChart

## 2023-01-14 NOTE — Telephone Encounter (Signed)
Wet prep positive for BV.  Called patient and updated her of this result, sent in metronidazole 500 mg twice daily x 7 days.  Counseled patient on BV prevention, and advised to wait for GC/chlamydia results.

## 2023-01-14 NOTE — Progress Notes (Signed)
    SUBJECTIVE:   CHIEF COMPLAINT / HPI:   Christie Fowler is a 22yo F w/ hx of depression that presents for vaginal discharge. - 3 wk hx of increase vaginal discharge and fishy smell - Denies dysuria, pain, itching, bleeding (aside from period) - Is sexually active - Remembers using a new vagisil soap about the time this started.  OBJECTIVE:   BP 116/73   Pulse 78   Ht 5\' 3"  (1.6 m)   Wt 113 lb (51.3 kg)   LMP 12/31/2022   SpO2 100%   BMI 20.02 kg/m   General: Alert, pleasant woman. NAD. HEENT: NCAT. MMM. Resp: Normal WOB on RA.  Ext: Moves all ext spontaneously Skin: Warm, well perfused  GU: Thick white fluid in vaginal vault on speculum exam  Dayshia CMA was chaperone  ASSESSMENT/PLAN:    Assessment & Plan Vaginal discharge Differential includes BV and yeast.  Less likely GC, CH, trichomoniasis.  Will check vaginal swab test.  Counseled on vaginal hygiene and avoiding disruption of vaginal pH and flora.    Lincoln Brigham, MD [facility]

## 2023-01-15 LAB — CERVICOVAGINAL ANCILLARY ONLY
Chlamydia: NEGATIVE
Comment: NEGATIVE
Comment: NEGATIVE
Comment: NORMAL
Neisseria Gonorrhea: NEGATIVE
Trichomonas: NEGATIVE

## 2023-01-17 ENCOUNTER — Telehealth: Payer: Self-pay

## 2023-01-17 DIAGNOSIS — N898 Other specified noninflammatory disorders of vagina: Secondary | ICD-10-CM

## 2023-01-17 MED ORDER — METRONIDAZOLE 500 MG PO TABS: 500.0000 mg | ORAL_TABLET | Freq: Two times a day (BID) | ORAL | 0 refills | Status: AC

## 2023-01-17 NOTE — Telephone Encounter (Signed)
Patient calls nurse line in regards to Flagyl.   She reports she picked the medication up, however she has misplaced it.   She reports she walked to the pharmacy and when she got home she could not find it. She did not take any of the medication.   Advised will send to provider who saw patient to advise on sending in another script.  Advised insurance may not pay for this one and she may have to pay out of pocket.   Will forward to provider who saw patient.

## 2023-01-18 NOTE — Telephone Encounter (Signed)
Attempted to contact patient.   No answer or option for VM.   If she calls back another prescription of Flagyl was called in for her.

## 2023-02-14 ENCOUNTER — Emergency Department (HOSPITAL_COMMUNITY): Payer: Self-pay

## 2023-02-14 ENCOUNTER — Emergency Department (HOSPITAL_COMMUNITY)
Admission: EM | Admit: 2023-02-14 | Discharge: 2023-02-14 | Discharge status: Against Medical Advice | Payer: Self-pay | Attending: Emergency Medicine | Admitting: Emergency Medicine

## 2023-02-14 DIAGNOSIS — Z5321 Procedure and treatment not carried out due to patient leaving prior to being seen by health care provider: Secondary | ICD-10-CM | POA: Insufficient documentation

## 2023-02-14 DIAGNOSIS — R109 Unspecified abdominal pain: Secondary | ICD-10-CM | POA: Insufficient documentation

## 2023-02-14 LAB — CBC WITH DIFFERENTIAL/PLATELET
Abs Immature Granulocytes: 0.01 10*3/uL (ref 0.00–0.07)
Basophils Absolute: 0 10*3/uL (ref 0.0–0.1)
Basophils Relative: 0 %
Eosinophils Absolute: 0 10*3/uL (ref 0.0–0.5)
Eosinophils Relative: 0 %
HCT: 36.5 % (ref 36.0–46.0)
Hemoglobin: 12.1 g/dL (ref 12.0–15.0)
Immature Granulocytes: 0 %
Lymphocytes Relative: 15 %
Lymphs Abs: 1 10*3/uL (ref 0.7–4.0)
MCH: 29.2 pg (ref 26.0–34.0)
MCHC: 33.2 g/dL (ref 30.0–36.0)
MCV: 88.2 fL (ref 80.0–100.0)
Monocytes Absolute: 0.5 10*3/uL (ref 0.1–1.0)
Monocytes Relative: 7 %
Neutro Abs: 5.1 10*3/uL (ref 1.7–7.7)
Neutrophils Relative %: 78 %
Platelets: 146 10*3/uL — ABNORMAL LOW (ref 150–400)
RBC: 4.14 MIL/uL (ref 3.87–5.11)
RDW: 13.3 % (ref 11.5–15.5)
WBC: 6.6 10*3/uL (ref 4.0–10.5)
nRBC: 0 % (ref 0.0–0.2)

## 2023-02-14 LAB — COMPREHENSIVE METABOLIC PANEL
ALT: 13 U/L (ref 0–44)
AST: 17 U/L (ref 15–41)
Albumin: 4.4 g/dL (ref 3.5–5.0)
Alkaline Phosphatase: 45 U/L (ref 38–126)
Anion gap: 9 (ref 5–15)
BUN: 7 mg/dL (ref 6–20)
CO2: 21 mmol/L — ABNORMAL LOW (ref 22–32)
Calcium: 9.3 mg/dL (ref 8.9–10.3)
Chloride: 109 mmol/L (ref 98–111)
Creatinine, Ser: 0.63 mg/dL (ref 0.44–1.00)
GFR, Estimated: >60 mL/min (ref >60)
Glucose, Bld: 91 mg/dL (ref 70–99)
Potassium: 3.7 mmol/L (ref 3.5–5.1)
Sodium: 139 mmol/L (ref 135–145)
Total Bilirubin: 1.2 mg/dL (ref 0.0–1.2)
Total Protein: 7.7 g/dL (ref 6.5–8.1)

## 2023-02-14 LAB — LIPASE, BLOOD: Lipase: 23 U/L (ref 11–51)

## 2023-02-14 LAB — HCG, SERUM, QUALITATIVE: Preg, Serum: NEGATIVE

## 2023-02-14 NOTE — ED Provider Triage Note (Signed)
 Emergency Medicine Provider Triage Evaluation Note  Christie Fowler , a 23 y.o. female  was evaluated in triage.  Pt complains of bilateral flank pain.  Has history of kidney infections as well as kidney stones.  Pain started this morning.  She states she had small amount of alcohol last night. LMP November   Review of Systems  Positive: As above Negative: As above  Physical Exam  BP 138/82 (BP Location: Right Arm)   Pulse 86   Temp 99.1 F (37.3 C)   Resp 16   SpO2 100%  Gen:   Awake, no distress   Resp:  Normal effort  MSK:   Moves extremities without difficulty  Other:  Bilateral CVA tenderness  Medical Decision Making  Medically screening exam initiated at 1:25 PM.  Appropriate orders placed.  Christie Fowler was informed that the remainder of the evaluation will be completed by another provider, this initial triage assessment does not replace that evaluation, and the importance of remaining in the ED until their evaluation is complete.    Christie Loge, PA-C 02/14/23 1327

## 2023-02-14 NOTE — ED Triage Notes (Signed)
 Pt reports waking up this morning and having bilateral flank/back pain. Pt reports hx of frequent UTI and one kidney stone in the past. Denies dysuria or hematuria. Pt states LMP was in November.

## 2023-02-14 NOTE — ED Notes (Signed)
 Pt called x3 in lobby for room. No response. Moved OTF.

## 2023-07-16 ENCOUNTER — Encounter: Payer: Self-pay | Admitting: *Deleted

## 2023-08-22 ENCOUNTER — Ambulatory Visit (HOSPITAL_COMMUNITY)
Admission: EM | Admit: 2023-08-22 | Discharge: 2023-08-23 | Disposition: A | Discharge status: Home / Self Care | Attending: Psychiatry | Admitting: Psychiatry

## 2023-08-22 DIAGNOSIS — R45851 Suicidal ideations: Secondary | ICD-10-CM | POA: Insufficient documentation

## 2023-08-22 DIAGNOSIS — F411 Generalized anxiety disorder: Secondary | ICD-10-CM | POA: Insufficient documentation

## 2023-08-22 DIAGNOSIS — F4321 Adjustment disorder with depressed mood: Secondary | ICD-10-CM | POA: Insufficient documentation

## 2023-08-22 DIAGNOSIS — F332 Major depressive disorder, recurrent severe without psychotic features: Secondary | ICD-10-CM | POA: Insufficient documentation

## 2023-08-22 DIAGNOSIS — Z79899 Other long term (current) drug therapy: Secondary | ICD-10-CM | POA: Insufficient documentation

## 2023-08-22 DIAGNOSIS — I498 Other specified cardiac arrhythmias: Secondary | ICD-10-CM | POA: Insufficient documentation

## 2023-08-22 LAB — POCT URINE DRUG SCREEN - MANUAL ENTRY (I-SCREEN)
POC Amphetamine UR: NOT DETECTED
POC Buprenorphine (BUP): NOT DETECTED
POC Cocaine UR: NOT DETECTED
POC Marijuana UR: NOT DETECTED
POC Methadone UR: NOT DETECTED
POC Methamphetamine UR: NOT DETECTED
POC Morphine: NOT DETECTED
POC Oxazepam (BZO): NOT DETECTED
POC Oxycodone UR: NOT DETECTED
POC Secobarbital (BAR): NOT DETECTED

## 2023-08-22 LAB — POC URINE PREG, ED: Preg Test, Ur: NEGATIVE

## 2023-08-22 MED ORDER — HALOPERIDOL LACTATE 5 MG/ML IJ SOLN: 5.0000 mg | Freq: Three times a day (TID) | INTRAMUSCULAR | Status: DC | PRN

## 2023-08-22 MED ORDER — LORAZEPAM 2 MG/ML IJ SOLN: 2.0000 mg | Freq: Three times a day (TID) | INTRAMUSCULAR | Status: DC | PRN

## 2023-08-22 MED ORDER — DIPHENHYDRAMINE HCL 50 MG PO CAPS: 50.0000 mg | ORAL_CAPSULE | Freq: Three times a day (TID) | ORAL | Status: DC | PRN

## 2023-08-22 MED ORDER — SERTRALINE HCL 25 MG PO TABS
25.0000 mg | ORAL_TABLET | Freq: Every day | ORAL | Status: DC
  Administered 2023-08-23: 25 mg via ORAL
  Filled 2023-08-22: qty 1

## 2023-08-22 MED ORDER — ALUM & MAG HYDROXIDE-SIMETH 200-200-20 MG/5ML PO SUSP: 30.0000 mL | ORAL | Status: DC | PRN

## 2023-08-22 MED ORDER — DIPHENHYDRAMINE HCL 50 MG/ML IJ SOLN: 50.0000 mg | Freq: Three times a day (TID) | INTRAMUSCULAR | Status: DC | PRN

## 2023-08-22 MED ORDER — HALOPERIDOL 5 MG PO TABS: 5.0000 mg | ORAL_TABLET | Freq: Three times a day (TID) | ORAL | Status: DC | PRN

## 2023-08-22 MED ORDER — ACETAMINOPHEN 325 MG PO TABS: 650.0000 mg | ORAL_TABLET | Freq: Four times a day (QID) | ORAL | Status: DC | PRN

## 2023-08-22 MED ORDER — HALOPERIDOL LACTATE 5 MG/ML IJ SOLN: 10.0000 mg | Freq: Three times a day (TID) | INTRAMUSCULAR | Status: DC | PRN

## 2023-08-22 MED ORDER — MAGNESIUM HYDROXIDE 400 MG/5ML PO SUSP: 30.0000 mL | Freq: Every day | ORAL | Status: DC | PRN

## 2023-08-22 MED ORDER — HYDROXYZINE HCL 25 MG PO TABS
25.0000 mg | ORAL_TABLET | Freq: Once | ORAL | Status: AC
  Administered 2023-08-22: 25 mg via ORAL
  Filled 2023-08-22: qty 1
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
## 2023-08-22 NOTE — ED Provider Notes (Signed)
 [facility] Continuous Assessment Admission H&P  Date: 08/22/23 Patient Name: Christie Fowler MRN: [id number] Chief Complaint: A lot at home  Diagnoses:  Final diagnoses:  Severe episode of recurrent major depressive disorder, without psychotic features [facility])    HPI: Christie Fowler is a 23 year-old female who presents to [facility] voluntarily, unaccompanied, reporting that things are not going well at home, and expressing concerns  about her safety, and the safety of her children. She reports that she is having relationship issues with the father of her 2 children, age 16 and 1. Patient recently lost her job but planning to start a new one Monday. Boyfriend has recently started a new job. Family is 2 months behind on rent.  Patient's mother moved in after she lost her job as well and has been staying with them. However, she does not help with the kids. She stays in her room and only takes care of the dog. Mom is not very active in finding employment. Mom has a boyfriend who also comes and stays with her in patient's apartment and he does not contribute to rent.  Patient reports  she feels frustrated  as her mom is not helping with finances and/or the children.  Today, patient became overwhelmed and started screaming laud and my mom didn't even seem to care. Patient left the house for a relaxation walk but, when she saw a bridge, she heard an internal voice saying  if you jump off this bridge and die, all your problems will go away.  She expresses guilt for not being a good mom.  She reports she was diagnosed with depression and anxiety in the past but never took medications, never followed up with a therapist. The depressive symptoms have been intensifying for about 3 weeks due to financial struggles and family issues.  Reports no hx of taking psychiatric medications.   Face-to-face evaluation by this provider after chart/nursing note review. 23 year old sitting in assessment room alone. She is  receptive upon approach. She is appropriately groomed with good hygiene. Alert and oriented x 4. Appears healthy and well composed. Has good eye contact. Speech is clear and coherent and well articulated. Thought process is organized and goal-directed. She appears anxious, sad and overwhelmed. Does not seem to be preoccupied or responding to internal stimuli. She denies hallucinations. Denies homicidal ideations. Patient reports that today she thought about killing herself for the first time. Thought about jumping off the bridge when she was taking a walk. Reports depressive symptoms including sadness, increased worrying, yelling at her children, impatience, lack of libido, insomnia and frequent crying. Reports her boyfriend has anger issues and suffers from depression  he is never happy, not even for his children.  Patient admits to using Marijuana but stopped 3 weeks ago. Denies alcohol use. Reports that her mother has a lot to do with  family instability: unemployed and not making any effort to find employment. Patient let her in after she lost her job and housing but she is making my life miserable.   Patient presents with severe symptoms of depression with no current treatment. She presents with recent thoughts and plan to end her life. Currently expressing anger toward her mother and I feel like I am gonna jump on her if I go back home. She reports she may need some medication to ease her anxiety and depression. She is educated about SSRIs and expresses interest in starting Zoloft  25 mg which may be increased to 50 if tolerated.  Also Vistaril  25 mg for anxiety is prescribed. Patient reports she is now motivated for therapy upon discharge. We will admit her to Observation unit for overnight monitoring. We will reassess in AM to determine disposition.   Total Time spent with patient: 45 minutes  Musculoskeletal  Strength & Muscle Tone: within normal limits Gait & Station: normal Patient leans:  N/A  Psychiatric Specialty Exam  Presentation General Appearance:  Appropriate for Environment  Eye Contact: Good  Speech: Clear and Coherent  Speech Volume: Normal  Handedness: Right   Mood and Affect  Mood: Anxious; Depressed  Affect: Depressed   Thought Process  Thought Processes: Coherent; Goal Directed  Descriptions of Associations:Intact  Orientation:Full (Time, Place and Person)  Thought Content:Logical    Hallucinations:Hallucinations: None  Ideas of Reference:None  Suicidal Thoughts:Suicidal Thoughts: Yes, Active SI Active Intent and/or Plan: With Plan  Homicidal Thoughts:Homicidal Thoughts: No   Sensorium  Memory: Immediate Good; Recent Good; Remote Good  Judgment: Fair  Insight: Fair   Chartered certified accountant: Fair  Attention Span: Fair  Recall: [employer] of Knowledge: Fair  Language: Fair   Psychomotor Activity  Psychomotor Activity: Psychomotor Activity: Normal   Assets  Assets: Manufacturing systems engineer; Desire for Improvement; Physical Health   Sleep  Sleep: Sleep: Poor   Nutritional Assessment (For OBS and FBC admissions only) Has the patient had a weight loss or gain of 10 pounds or more in the last 3 months?: No Has the patient had a decrease in food intake/or appetite?: No Does the patient have eating habits or behaviors that may be indicators of an eating disorder including binging or inducing vomiting?: No Has the patient recently lost weight without trying?: 0 Has the patient been eating poorly because of a decreased appetite?: 0 Malnutrition Screening Tool Score: 0    Physical Exam Vitals and nursing note reviewed.  Constitutional:      Appearance: Normal appearance.  HENT:     Head: Normocephalic and atraumatic.     Right Ear: Tympanic membrane normal.     Left Ear: Tympanic membrane normal.     Nose: Nose normal.     Mouth/Throat:     Mouth: Mucous membranes are moist.   Eyes:     Extraocular Movements: Extraocular movements intact.     Pupils: Pupils are equal, round, and reactive to light.  Cardiovascular:     Rate and Rhythm: Normal rate.     Pulses: Normal pulses.  Pulmonary:     Effort: Pulmonary effort is normal.  Musculoskeletal:        General: Normal range of motion.     Cervical back: Normal range of motion and neck supple.  Neurological:     General: No focal deficit present.     Mental Status: She is alert and oriented to person, place, and time.    Review of Systems  Constitutional: Negative.   HENT: Negative.    Eyes: Negative.   Respiratory: Negative.    Cardiovascular: Negative.   Gastrointestinal: Negative.   Genitourinary: Negative.   Musculoskeletal: Negative.   Skin: Negative.   Neurological: Negative.   Endo/Heme/Allergies: Negative.   Psychiatric/Behavioral:  Positive for depression and suicidal ideas. The patient is nervous/anxious and has insomnia.     Blood pressure (!) 123/97, pulse 93, temperature 98.2 F (36.8 C), temperature source Oral, resp. rate 18, SpO2 100%, currently breastfeeding. There is no height or weight on file to calculate BMI.  Past Psychiatric History: MDD, GAD   Is  the patient at risk to self? Yes  Has the patient been a risk to self in the past 6 months? Yes .    Has the patient been a risk to self within the distant past? No   Is the patient a risk to others? Yes   Has the patient been a risk to others in the past 6 months? No   Has the patient been a risk to others within the distant past? No   Past Medical History: NA  Family History: NA  Social History: Unemployed. 2 children, 3 and 1  Last Labs:  No visits with results within 6 Month(s) from this visit.  Latest known visit with results is:  Admission on 02/14/2023, Discharged on 02/14/2023  Component Date Value Ref Range Status   WBC 02/14/2023 6.6  4.0 - 10.5 K/uL Final   RBC 02/14/2023 4.14  3.87 - 5.11 MIL/uL Final    Hemoglobin 02/14/2023 12.1  12.0 - 15.0 g/dL Final   HCT 98/90/7974 36.5  36.0 - 46.0 % Final   MCV 02/14/2023 88.2  80.0 - 100.0 fL Final   MCH 02/14/2023 29.2  26.0 - 34.0 pg Final   MCHC 02/14/2023 33.2  30.0 - 36.0 g/dL Final   RDW 98/90/7974 13.3  11.5 - 15.5 % Final   Platelets 02/14/2023 146 (L)  150 - 400 K/uL Final   nRBC 02/14/2023 0.0  0.0 - 0.2 % Final   Neutrophils Relative % 02/14/2023 78  % Final   Neutro Abs 02/14/2023 5.1  1.7 - 7.7 K/uL Final   Lymphocytes Relative 02/14/2023 15  % Final   Lymphs Abs 02/14/2023 1.0  0.7 - 4.0 K/uL Final   Monocytes Relative 02/14/2023 7  % Final   Monocytes Absolute 02/14/2023 0.5  0.1 - 1.0 K/uL Final   Eosinophils Relative 02/14/2023 0  % Final   Eosinophils Absolute 02/14/2023 0.0  0.0 - 0.5 K/uL Final   Basophils Relative 02/14/2023 0  % Final   Basophils Absolute 02/14/2023 0.0  0.0 - 0.1 K/uL Final   Immature Granulocytes 02/14/2023 0  % Final   Abs Immature Granulocytes 02/14/2023 0.01  0.00 - 0.07 K/uL Final   Performed at [facility], [address], [place], [place] [address]   Sodium 02/14/2023 139  135 - 145 mmol/L Final   Potassium 02/14/2023 3.7  3.5 - 5.1 mmol/L Final   Chloride 02/14/2023 109  98 - 111 mmol/L Final   CO2 02/14/2023 21 (L)  22 - 32 mmol/L Final   Glucose, Bld 02/14/2023 91  70 - 99 mg/dL Final   Glucose reference range applies only to samples taken after fasting for at least 8 hours.   BUN 02/14/2023 7  6 - 20 mg/dL Final   Creatinine, Ser 02/14/2023 0.63  0.44 - 1.00 mg/dL Final   Calcium 98/90/7974 9.3  8.9 - 10.3 mg/dL Final   Total Protein 98/90/7974 7.7  6.5 - 8.1 g/dL Final   Albumin 98/90/7974 4.4  3.5 - 5.0 g/dL Final   AST 98/90/7974 17  15 - 41 U/L Final   ALT 02/14/2023 13  0 - 44 U/L Final   Alkaline Phosphatase 02/14/2023 45  38 - 126 U/L Final   Total Bilirubin 02/14/2023 1.2  0.0 - 1.2 mg/dL Final   GFR, Estimated 02/14/2023 >60  >60 mL/min Final   Comment: (NOTE) Calculated  using the CKD-EPI Creatinine Equation (2021)    Anion gap 02/14/2023 9  5 - 15 Final  Performed at [facility], [address] [place], [place] [address]   Lipase 02/14/2023 23  11 - 51 U/L Final   Performed at [facility], [address] [place], [place] [address]   Preg, Serum 02/14/2023 NEGATIVE  NEGATIVE Final   Comment:        THE SENSITIVITY OF THIS METHODOLOGY IS >10 mIU/mL. Performed at [facility], [address], [place],  [address]     Allergies: Patient has no known allergies.  Medications:  Facility Ordered Medications  Medication   acetaminophen  (TYLENOL ) tablet 650 mg   alum & mag hydroxide-simeth (MAALOX/MYLANTA) 200-200-20 MG/5ML suspension 30 mL   magnesium  hydroxide (MILK OF MAGNESIA) suspension 30 mL   haloperidol  (HALDOL ) tablet 5 mg   And   diphenhydrAMINE  (BENADRYL ) capsule 50 mg   haloperidol  lactate (HALDOL ) injection 5 mg   And   diphenhydrAMINE  (BENADRYL ) injection 50 mg   And   LORazepam  (ATIVAN ) injection 2 mg   haloperidol  lactate (HALDOL ) injection 10 mg   And   diphenhydrAMINE  (BENADRYL ) injection 50 mg   And   LORazepam  (ATIVAN ) injection 2 mg   hydrOXYzine  (ATARAX ) tablet 25 mg   [START ON 08/23/2023] sertraline  (ZOLOFT ) tablet 25 mg   PTA Medications  Medication Sig   aspirin  EC 81 MG tablet Take 1 tablet (81 mg total) by mouth daily. Swallow whole. (Patient not taking: Reported on 12/19/2021)   cyclobenzaprine  (FLEXERIL ) 5 MG tablet Take 1 tablet (5 mg total) by mouth 3 (three) times daily as needed for muscle spasms. (Patient not taking: Reported on 12/19/2021)   ferrous sulfate  325 (65 FE) MG tablet Take 1 tablet (325 mg total) by mouth every other day. (Patient not taking: Reported on 12/19/2021)   acetaminophen  (TYLENOL ) 325 MG tablet Take 2 tablets (650 mg total) by mouth every 4 (four) hours as needed (for pain scale < 4). (Patient not taking: Reported on 12/19/2021)   ibuprofen  (ADVIL ) 600 MG tablet  Take 1 tablet (600 mg total) by mouth every 6 (six) hours. (Patient not taking: Reported on 12/19/2021)      Medical Decision Making  Admission to observation unit  Initiate agitation medication  protocol Acetaminophen  650 mg PO Q 6 PRN Maalox 30 ml PO Q 4 PRN Milk of Magnesia 30 ml PO Daily PRN Sertraline  20 mg PO Daily Hydroxyzine  25 mg PO TID PRN  Labs: CBC, CMP, RPR, TSH, UDS, A1C, UPT, UA, Magnesium , Ethanol, Hepatic Function, Lipid panel  EKG  Recommendations  Based on my evaluation the patient does not appear to have an emergency medical condition.  Randall Bouquet, NP 08/22/23  10:35 PM

## 2023-08-23 LAB — CBC WITH DIFFERENTIAL/PLATELET
Abs Immature Granulocytes: 0.01 K/uL (ref 0.00–0.07)
Basophils Absolute: 0.1 K/uL (ref 0.0–0.1)
Basophils Relative: 1 %
Eosinophils Absolute: 0.1 K/uL (ref 0.0–0.5)
Eosinophils Relative: 2 %
HCT: 38.9 % (ref 36.0–46.0)
Hemoglobin: 12.7 g/dL (ref 12.0–15.0)
Immature Granulocytes: 0 %
Lymphocytes Relative: 54 %
Lymphs Abs: 2.9 K/uL (ref 0.7–4.0)
MCH: 29.5 pg (ref 26.0–34.0)
MCHC: 32.6 g/dL (ref 30.0–36.0)
MCV: 90.5 fL (ref 80.0–100.0)
Monocytes Absolute: 0.3 K/uL (ref 0.1–1.0)
Monocytes Relative: 6 %
Neutro Abs: 2 K/uL (ref 1.7–7.7)
Neutrophils Relative %: 37 %
Platelets: 200 K/uL (ref 150–400)
RBC: 4.3 MIL/uL (ref 3.87–5.11)
RDW: 12.9 % (ref 11.5–15.5)
WBC: 5.4 K/uL (ref 4.0–10.5)
nRBC: 0 % (ref 0.0–0.2)

## 2023-08-23 LAB — URINALYSIS, ROUTINE W REFLEX MICROSCOPIC
Bilirubin Urine: NEGATIVE
Glucose, UA: NEGATIVE mg/dL
Hgb urine dipstick: NEGATIVE
Ketones, ur: NEGATIVE mg/dL
Leukocytes,Ua: NEGATIVE
Nitrite: NEGATIVE
Protein, ur: NEGATIVE mg/dL
Specific Gravity, Urine: 1.019 (ref 1.005–1.030)
pH: 5 (ref 5.0–8.0)

## 2023-08-23 LAB — LIPID PANEL
Cholesterol: 205 mg/dL — ABNORMAL HIGH (ref 0–200)
HDL: 64 mg/dL (ref >40)
LDL Cholesterol: 133 mg/dL — ABNORMAL HIGH (ref 0–99)
Total CHOL/HDL Ratio: 3.2 ratio
Triglycerides: 38 mg/dL (ref <150)
VLDL: 8 mg/dL (ref 0–40)

## 2023-08-23 LAB — COMPREHENSIVE METABOLIC PANEL WITH GFR
ALT: 14 U/L (ref 0–44)
AST: 19 U/L (ref 15–41)
Albumin: 4.5 g/dL (ref 3.5–5.0)
Alkaline Phosphatase: 42 U/L (ref 38–126)
Anion gap: 11 (ref 5–15)
BUN: 15 mg/dL (ref 6–20)
CO2: 20 mmol/L — ABNORMAL LOW (ref 22–32)
Calcium: 10 mg/dL (ref 8.9–10.3)
Chloride: 107 mmol/L (ref 98–111)
Creatinine, Ser: 0.73 mg/dL (ref 0.44–1.00)
GFR, Estimated: >60 mL/min (ref >60)
Glucose, Bld: 81 mg/dL (ref 70–99)
Potassium: 4 mmol/L (ref 3.5–5.1)
Sodium: 138 mmol/L (ref 135–145)
Total Bilirubin: 1 mg/dL (ref 0.0–1.2)
Total Protein: 7.8 g/dL (ref 6.5–8.1)

## 2023-08-23 LAB — HEMOGLOBIN A1C
Hgb A1c MFr Bld: 4.9 % (ref 4.8–5.6)
Mean Plasma Glucose: 93.93 mg/dL

## 2023-08-23 LAB — MAGNESIUM: Magnesium: 2.1 mg/dL (ref 1.7–2.4)

## 2023-08-23 LAB — TSH: TSH: 3.193 u[IU]/mL (ref 0.350–4.500)

## 2023-08-23 LAB — ETHANOL: Alcohol, Ethyl (B): <15 mg/dL (ref <15)

## 2023-08-23 MED ORDER — HYDROXYZINE PAMOATE 25 MG PO CAPS: 25.0000 mg | ORAL_CAPSULE | Freq: Two times a day (BID) | ORAL | 0 refills | Status: AC | PRN

## 2023-08-23 MED ORDER — SERTRALINE HCL 25 MG PO TABS: 25.0000 mg | ORAL_TABLET | Freq: Every day | ORAL | 0 refills | Status: DC

## 2023-08-23 NOTE — ED Notes (Signed)
 Patient currently calm and sleeping in recliner. Pt observed/assessed in adult observation unit. RR even and unlabored, appearing in no noted distress. Environmental check complete

## 2023-08-23 NOTE — ED Notes (Signed)
 Christie Fowler arrived to the [facility] for observation. Patient is A&Ox 4. Patient states what brought them into the facility is that she felt overwhelmed at home and let her mom know that she needed a break. Patient reports that she started walking and come across a bridge and stated that she heard a voice telling her to jump of the bridge. Patient denies any current suicidal ideations or homicidal ideations. Patent states that she has never had any thoughts suicidal ideations before or homicidal and that it scared her that she had that thought so she came to [facility]. Pt contracts for safety. Patient currently denies any auditory hallucinations, visual hallucinations, or CAH. Skin check conducted by Corean PEAK and [place], MHT. Patient oriented to the unit and provided food, beverage, and snack. Patient verbalized understanding. Patient currently resting in bed. No distress noted.

## 2023-08-23 NOTE — Discharge Instructions (Addendum)

## 2023-08-23 NOTE — ED Provider Notes (Signed)
 FBC/OBS ASAP Discharge Summary  Date and Time: 08/23/2023 9:50 AM  Name: Christie Fowler  MRN:  [id number]   Discharge Diagnoses:  Final diagnoses:  Severe episode of recurrent major depressive disorder, without psychotic features (HCC)    Subjective: 08/23/23 Pt was alert this morning and able to ambulate to and from the bathroom without concern. Sleep was great and mood is much better. Expresses that her anxiety and depression are much better, and that the conversation she had yesterday with staff about her relationship with her mother was extremely beneficial for her, and that she has new determination to get her life more under her own control.  Denies SI, HI, A&VH, and physical complaints. Energy is improved. Expressed that she is still interested in starting on Zoloft , and that a PRN for anxiety also sounds beneficial.  Stay Summary: Pt arrived 08/22/2023 at night for SI after life stressors had increased. Earlier that night, she had a thought of jumping off a bridge, which scared her as she did not want to die, and she came to [facility] seeking help voluntarily. She spoke with staff, slept well, and is discharged the following morning. Counseling regarding her relationship with her family was deemed by patient to be very beneficial. She denied SI, HI, A&VH and appears to have very strong protective factors against suicide.  Total Time spent with patient: 20 minutes  Past Psychiatric History: MDD, GAD, no medications in the past Past Medical History: none reported Social History: Unemployed, starting new job on 08/26/2023, 2 children (ages 3 and 1) and a partner. Mother w/ boyfriend also live at house and are not helpful with family or finances.   Current Medications:  Current Facility-Administered Medications  Medication Dose Route Frequency Provider Last Rate Last Admin   acetaminophen  (TYLENOL ) tablet 650 mg  650 mg Oral Q6H PRN Randall Starlyn HERO, NP       alum & mag hydroxide-simeth  (MAALOX/MYLANTA) 200-200-20 MG/5ML suspension 30 mL  30 mL Oral Q4H PRN Randall, Veronique M, NP       haloperidol  (HALDOL ) tablet 5 mg  5 mg Oral TID PRN Randall Starlyn HERO, NP       And   diphenhydrAMINE  (BENADRYL ) capsule 50 mg  50 mg Oral TID PRN Randall Starlyn HERO, NP       haloperidol  lactate (HALDOL ) injection 5 mg  5 mg Intramuscular TID PRN Randall Starlyn HERO, NP       And   diphenhydrAMINE  (BENADRYL ) injection 50 mg  50 mg Intramuscular TID PRN Randall Starlyn HERO, NP       And   LORazepam  (ATIVAN ) injection 2 mg  2 mg Intramuscular TID PRN Randall Starlyn HERO, NP       haloperidol  lactate (HALDOL ) injection 10 mg  10 mg Intramuscular TID PRN Randall Starlyn HERO, NP       And   diphenhydrAMINE  (BENADRYL ) injection 50 mg  50 mg Intramuscular TID PRN Randall Starlyn HERO, NP       And   LORazepam  (ATIVAN ) injection 2 mg  2 mg Intramuscular TID PRN Randall Starlyn HERO, NP       magnesium  hydroxide (MILK OF MAGNESIA) suspension 30 mL  30 mL Oral Daily PRN Randall, Veronique M, NP       sertraline  (ZOLOFT ) tablet 25 mg  25 mg Oral Daily Byungura, Veronique M, NP   25 mg at 08/23/23 9093   Current Outpatient Medications  Medication Sig Dispense Refill   Ferrous Sulfate  (IRON PO) Take 1  tablet by mouth daily.      PTA Medications:  Facility Ordered Medications  Medication   acetaminophen  (TYLENOL ) tablet 650 mg   alum & mag hydroxide-simeth (MAALOX/MYLANTA) 200-200-20 MG/5ML suspension 30 mL   magnesium  hydroxide (MILK OF MAGNESIA) suspension 30 mL   haloperidol  (HALDOL ) tablet 5 mg   And   diphenhydrAMINE  (BENADRYL ) capsule 50 mg   haloperidol  lactate (HALDOL ) injection 5 mg   And   diphenhydrAMINE  (BENADRYL ) injection 50 mg   And   LORazepam  (ATIVAN ) injection 2 mg   haloperidol  lactate (HALDOL ) injection 10 mg   And   diphenhydrAMINE  (BENADRYL ) injection 50 mg   And   LORazepam  (ATIVAN ) injection 2 mg   [COMPLETED] hydrOXYzine  (ATARAX ) tablet 25 mg    sertraline  (ZOLOFT ) tablet 25 mg   PTA Medications  Medication Sig   Ferrous Sulfate  (IRON PO) Take 1 tablet by mouth daily.       08/22/2023   10:34 PM 01/14/2023   10:19 AM 08/09/2021    8:57 AM  Depression screen PHQ 2/9  Decreased Interest 2 0 0  Down, Depressed, Hopeless 2 0 0  PHQ - 2 Score 4 0 0  Altered sleeping 2 0 0  Tired, decreased energy 1 0 0  Change in appetite 0 2 0  Feeling bad or failure about yourself  1 1 0  Trouble concentrating 1 0 0  Moving slowly or fidgety/restless 0 0 0  Suicidal thoughts 1 0 0  PHQ-9 Score 10 3 0  Difficult doing work/chores Very difficult  Not difficult at all    Flowsheet Row ED from 08/22/2023 in [facility] ED from 02/14/2023 in [facility] Emergency Department at [facility] ED from 08/10/2022 in [facility] Emergency Department at [facility]  C-SSRS RISK CATEGORY Low Risk No Risk No Risk    Musculoskeletal  Strength & Muscle Tone: within normal limits Gait & Station: normal Patient leans: N/A  Psychiatric Specialty Exam  Presentation  General Appearance:  Appropriate for Environment  Eye Contact: Good  Speech: Normal Rate  Speech Volume: Normal  Handedness: Right   Mood and Affect  Mood: Euthymic  Affect: Congruent   Thought Process  Thought Processes: Goal Directed; Coherent; Linear  Descriptions of Associations:Intact  Orientation:Full (Time, Place and Person)  Thought Content:Logical     Hallucinations:Hallucinations: None  Ideas of Reference:None  Suicidal Thoughts:Suicidal Thoughts: No SI Active Intent and/or Plan: With Plan  Homicidal Thoughts:Homicidal Thoughts: No   Sensorium  Memory: Immediate Good; Recent Good; Remote Good  Judgment: Good  Insight: Good   Executive Functions  Concentration: Good  Attention Span: Fair  Recall: Fair  Fund of Knowledge: Fair  Language: Fair   Psychomotor Activity  Psychomotor  Activity: Psychomotor Activity: Normal   Assets  Assets: Housing; Manufacturing systems engineer; Desire for Improvement; Social Support; Physical Health; Resilience; Transportation; Vocational/Educational   Sleep  Sleep: Sleep: Good  No Safety Checks orders active in given range  Nutritional Assessment (For OBS and FBC admissions only) Has the patient had a weight loss or gain of 10 pounds or more in the last 3 months?: No Has the patient had a decrease in food intake/or appetite?: No Does the patient have eating habits or behaviors that may be indicators of an eating disorder including binging or inducing vomiting?: No Has the patient recently lost weight without trying?: 0 Has the patient been eating poorly because of a decreased appetite?: 0 Malnutrition Screening Tool Score: 0  Physical Exam  Physical Exam Vitals reviewed.  Constitutional:      General: She is not in acute distress.    Appearance: She is not toxic-appearing.  Pulmonary:     Effort: Pulmonary effort is normal. No respiratory distress.  Skin:    General: Skin is warm and dry.  Neurological:     Mental Status: She is alert.    Review of Systems  Constitutional: Negative.   Respiratory: Negative.    Cardiovascular: Negative.   Gastrointestinal: Negative.    Blood pressure 121/89, pulse 77, temperature 97.6 F (36.4 C), temperature source Oral, resp. rate 17, SpO2 100%, currently breastfeeding. There is no height or weight on file to calculate BMI.  Demographic Factors:  Unemployed  Loss Factors: Financial problems/change in socioeconomic status  Historical Factors: NA  Risk Reduction Factors:   Responsible for children under 91 years of age, Sense of responsibility to family, Religious beliefs about death, Living with another person, especially a relative, Positive social support, and Positive coping skills or problem solving skills  Continued Clinical Symptoms:  Hx of MDD and GAD  Cognitive  Features That Contribute To Risk:  None    Suicide Risk:  Minimal: No identifiable suicidal ideation.  Patients presenting with no risk factors but with morbid  ruminations; may be classified as minimal risk based on the severity of the depressive symptoms  Impression: Adjustment disorder w/ depressed mood - Pt's temporary depressed mood (which has now resolved) appears to be a normal response to a stressful situation.   Plan Of Care/Follow-up recommendations:  Adjustment disorder w/ depressed mood  Given hx of depression and that the financial nature of the stressor does not have a clear end-point, discussed pros and cons of medication assistance for mood. Pt has been instructed on risks/benefits of Zoloft  and Hydroxyzine , and is interested in starting these medications as prescribed. - Zoloft  25mg  every day - Hydroxyzine  25mg  at bedtime and up to 1 time daily PRN for anxiety. Pt is not breastfeeding.  Disposition: Discharge to home.  Penne Mori, DO 08/23/2023, 9:50 AM

## 2023-08-23 NOTE — ED Notes (Addendum)
 Patient currently sleeping and resting in recliner. Pt observed/assessed in [facility] observation room. RR even and unlabored, appearing in no noted distress. Environmental check complete

## 2023-08-25 ENCOUNTER — Other Ambulatory Visit: Payer: Self-pay | Admitting: Psychiatry

## 2023-08-25 MED ORDER — SERTRALINE HCL 25 MG PO TABS: 25.0000 mg | ORAL_TABLET | Freq: Every day | ORAL | 0 refills | Status: AC

## 2023-10-10 ENCOUNTER — Ambulatory Visit: Payer: Self-pay | Admitting: Student

## 2023-11-13 ENCOUNTER — Encounter: Payer: Self-pay | Admitting: Student

## 2023-11-13 ENCOUNTER — Ambulatory Visit (INDEPENDENT_AMBULATORY_CARE_PROVIDER_SITE_OTHER): Payer: Self-pay | Admitting: Student

## 2023-11-13 VITALS — BP 110/70 | HR 71 | Ht 63.0 in | Wt 114.8 lb

## 2023-11-13 DIAGNOSIS — Z3046 Encounter for surveillance of implantable subdermal contraceptive: Secondary | ICD-10-CM

## 2023-11-13 NOTE — Patient Instructions (Addendum)
 Pleasure to meet you today.  Today we took out your Nexplanon   Nexplanon  Instructions After Removal and Insertion   Keep bandage clean and dry for 24 hours   May use ice/Tylenol /Ibuprofen  for soreness or pain   If you develop fever, drainage or increased warmth from incision site-contact office immediately

## 2023-11-13 NOTE — Progress Notes (Signed)
    SUBJECTIVE:   CHIEF COMPLAINT / HPI:   23 year old female presenting today for Nexplanon  removal.  Nexplanon  was placed on 10/13/2028. Patient is looking to conceive with in the next year and not looking to start any contraceptive at this time.  PERTINENT  PMH / PSH: Reviewed   OBJECTIVE:   There were no vitals taken for this visit.   Physical Exam General: Alert, well appearing, NAD Cardiovascular: Regular rate and rythmn. Well perfused Respiratory:Normal WOB on RA Psych: Pleasant, good judgement and cooperative  ASSESSMENT/PLAN:   GYNECOLOGY PROCEDURE NOTE  Hilja Hardeman is a 23 y.o. [id number] here for Nexplanon  removal. No other gynecologic concerns.  Nexplanon  Removal Patient identified, informed consent performed, consent signed.   Appropriate time out taken. Nexplanon  site identified.  Area prepped in usual sterile fashon. One ml of 1% lidocaine  was used to anesthetize the area at the distal end of the implant. A small stab incision was made right beside the implant on the distal portion.  The Nexplanon  rod was grasped using hemostats and removed without difficulty.  There was minimal blood loss. There were no complications.  3 ml of 1% lidocaine  was injected around the incision for post-procedure analgesia.  Steri-strips were applied over the small incision.  A pressure bandage was applied to reduce any bruising.  The patient tolerated the procedure well and was given post procedure instructions.  Patient is NOT planning to attempt conception.  Norleen April, MD 11/13/2023, 9:31 AM PGY-3, [facility]
